# Patient Record
Sex: Female | Born: 1985 | Race: White | Hispanic: No | Marital: Single | Smoking: Current some day smoker
Health system: Southern US, Community
[De-identification: ages and names within clinical notes are randomized; demographics above are authoritative.]

## PROBLEM LIST (undated history)

## (undated) DIAGNOSIS — F410 Panic disorder [episodic paroxysmal anxiety] without agoraphobia: Secondary | ICD-10-CM

## (undated) DIAGNOSIS — F329 Major depressive disorder, single episode, unspecified: Secondary | ICD-10-CM

## (undated) DIAGNOSIS — E039 Hypothyroidism, unspecified: Secondary | ICD-10-CM

## (undated) DIAGNOSIS — G8929 Other chronic pain: Secondary | ICD-10-CM

## (undated) DIAGNOSIS — I10 Essential (primary) hypertension: Secondary | ICD-10-CM

## (undated) DIAGNOSIS — L732 Hidradenitis suppurativa: Secondary | ICD-10-CM

## (undated) DIAGNOSIS — M549 Dorsalgia, unspecified: Secondary | ICD-10-CM

## (undated) DIAGNOSIS — R000 Tachycardia, unspecified: Secondary | ICD-10-CM

## (undated) DIAGNOSIS — F32A Depression, unspecified: Secondary | ICD-10-CM

## (undated) DIAGNOSIS — E282 Polycystic ovarian syndrome: Secondary | ICD-10-CM

## (undated) HISTORY — PX: CHOLECYSTECTOMY: SHX55

## (undated) HISTORY — PX: DILATION AND CURETTAGE OF UTERUS: SHX78

## (undated) HISTORY — DX: Essential (primary) hypertension: I10

---
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
## 2008-09-28 ENCOUNTER — Emergency Department (HOSPITAL_COMMUNITY): Admission: EM | Admit: 2008-09-28 | Discharge: 2008-09-29 | Payer: Self-pay | Admitting: Emergency Medicine

## 2009-01-31 ENCOUNTER — Emergency Department (HOSPITAL_COMMUNITY): Admission: EM | Admit: 2009-01-31 | Discharge: 2009-02-01 | Payer: Self-pay | Admitting: Emergency Medicine

## 2010-02-05 ENCOUNTER — Ambulatory Visit: Payer: Self-pay | Admitting: Family Medicine

## 2010-02-13 ENCOUNTER — Emergency Department: Payer: Self-pay | Admitting: Emergency Medicine

## 2010-02-16 ENCOUNTER — Observation Stay: Payer: Self-pay | Admitting: Obstetrics and Gynecology

## 2010-02-19 LAB — PATHOLOGY REPORT

## 2010-05-19 ENCOUNTER — Emergency Department (HOSPITAL_COMMUNITY)
Admission: EM | Admit: 2010-05-19 | Discharge: 2010-05-19 | Disposition: A | Discharge status: Home / Self Care | Payer: Self-pay | Attending: Emergency Medicine | Admitting: Emergency Medicine

## 2010-05-19 DIAGNOSIS — N946 Dysmenorrhea, unspecified: Secondary | ICD-10-CM | POA: Insufficient documentation

## 2010-05-19 DIAGNOSIS — R109 Unspecified abdominal pain: Secondary | ICD-10-CM | POA: Insufficient documentation

## 2010-05-19 DIAGNOSIS — R110 Nausea: Secondary | ICD-10-CM | POA: Insufficient documentation

## 2010-05-19 LAB — URINALYSIS, ROUTINE W REFLEX MICROSCOPIC
Bilirubin Urine: NEGATIVE
Glucose, UA: NEGATIVE mg/dL
Leukocytes, UA: NEGATIVE
Nitrite: NEGATIVE
Specific Gravity, Urine: 1.013 (ref 1.005–1.030)
pH: 7.5 (ref 5.0–8.0)

## 2010-05-19 LAB — URINE MICROSCOPIC-ADD ON

## 2010-05-19 LAB — PREGNANCY, URINE: Preg Test, Ur: NEGATIVE

## 2010-06-13 ENCOUNTER — Emergency Department (HOSPITAL_COMMUNITY)
Admission: EM | Admit: 2010-06-13 | Discharge: 2010-06-14 | Disposition: A | Discharge status: Home / Self Care | Payer: Self-pay | Attending: Emergency Medicine | Admitting: Emergency Medicine

## 2010-06-13 DIAGNOSIS — R110 Nausea: Secondary | ICD-10-CM | POA: Insufficient documentation

## 2010-06-13 DIAGNOSIS — R51 Headache: Secondary | ICD-10-CM | POA: Insufficient documentation

## 2010-06-13 LAB — POCT PREGNANCY, URINE: Preg Test, Ur: NEGATIVE

## 2010-06-13 LAB — GLUCOSE, CAPILLARY: Glucose-Capillary: 127 mg/dL — ABNORMAL HIGH (ref 70–99)

## 2010-06-14 LAB — ETHANOL: Alcohol, Ethyl (B): <5 mg/dL (ref 0–10)

## 2010-06-14 LAB — BASIC METABOLIC PANEL
BUN: 5 mg/dL — ABNORMAL LOW (ref 6–23)
Calcium: 8.8 mg/dL (ref 8.4–10.5)
Creatinine, Ser: 0.67 mg/dL (ref 0.4–1.2)
GFR calc non Af Amer: >60 mL/min (ref >60)

## 2010-06-14 LAB — URINALYSIS, ROUTINE W REFLEX MICROSCOPIC
Nitrite: NEGATIVE
Protein, ur: NEGATIVE mg/dL
Urobilinogen, UA: 0.2 mg/dL (ref 0.0–1.0)

## 2010-06-14 LAB — CBC
MCV: 88.7 fL (ref 78.0–100.0)
Platelets: 205 10*3/uL (ref 150–400)
WBC: 8.3 10*3/uL (ref 4.0–10.5)

## 2010-06-14 LAB — DIFFERENTIAL
Eosinophils Absolute: 0 10*3/uL (ref 0.0–0.7)
Lymphs Abs: 2.1 10*3/uL (ref 0.7–4.0)
Neutrophils Relative %: 69 % (ref 43–77)

## 2010-06-14 LAB — RAPID URINE DRUG SCREEN, HOSP PERFORMED
Amphetamines: NOT DETECTED
Barbiturates: NOT DETECTED
Opiates: NOT DETECTED

## 2010-07-01 ENCOUNTER — Emergency Department (HOSPITAL_COMMUNITY)
Admission: EM | Admit: 2010-07-01 | Discharge: 2010-07-01 | Disposition: A | Discharge status: Home / Self Care | Payer: Self-pay | Attending: Emergency Medicine | Admitting: Emergency Medicine

## 2010-07-01 DIAGNOSIS — F988 Other specified behavioral and emotional disorders with onset usually occurring in childhood and adolescence: Secondary | ICD-10-CM | POA: Insufficient documentation

## 2010-07-01 DIAGNOSIS — K625 Hemorrhage of anus and rectum: Secondary | ICD-10-CM | POA: Insufficient documentation

## 2010-07-01 DIAGNOSIS — F319 Bipolar disorder, unspecified: Secondary | ICD-10-CM | POA: Insufficient documentation

## 2010-07-01 DIAGNOSIS — K6289 Other specified diseases of anus and rectum: Secondary | ICD-10-CM | POA: Insufficient documentation

## 2010-07-24 NOTE — Op Note (Signed)
   NAME:  Laurie Macias, CHOI                         ACCOUNT NO.:  [id number]   MEDICAL RECORD NO.:  [id number]                   PATIENT TYPE:  AMB   LOCATION:  DAY                                  FACILITY:  [facility]   PHYSICIAN:  Tilda Burrow, M.D.              DATE OF BIRTH:  [birth year]   DATE OF PROCEDURE:  DATE OF DISCHARGE:                                 OPERATIVE REPORT   PREOPERATIVE DIAGNOSIS:  Missed abortion, [redacted] weeks gestation.  Blood type O  positive.   POSTOPERATIVE DIAGNOSIS:  Missed abortion, [redacted] weeks gestation.  Blood type O  positive.   PROCEDURE:  Suction D&C.   SURGEON:  Tilda Burrow, M.D.   ASSISTANT:  None.   ANESTHESIA:  General with endotracheal intubation.   COMPLICATIONS:  None.   FINDINGS:  The uterus sounded to 12 cm pre-procedure.  Generous tissue  fragments and blood consistent with missed AB.   DESCRIPTION OF PROCEDURE:  The patient was taken to the operating room and  prepped and draped in the usual fashion for suction D&C with legs in yellow  fin supports and general anesthesia obtained with endotracheal intubation  successfully achieved.   A single-tooth tenaculum was used to grasp the cervix, with bivalve speculum  in place and the uterus dilated to 31 [place] in the anteflex position.  A 9-  mm curved suction curet was introduced easily.  Suction curettage under 45  mmHg of suction pressure performed removing blood and tissue fragments  consistent with missed AB.  Estimated blood loss 150 cc.  Sharp curettage at  the end of the procedure confirmed satisfactory uterine evacuation and a  smaller uterine cavity.  No suspicious of perforation or complication noted.  The patient went to the recovery room in good condition.   NOTE TO OFFICE:  The patient had several preoperative questions about  contraception in the future and has wishes to use oral contraceptives if  they will not cause weight gain but has been counseled over alternative  methods, including FemRing.                                               Tilda Burrow, M.D.    JVF/MEDQ  D:  05/31/2002  T:  06/01/2002  Job:  [id number]

## 2010-07-24 NOTE — H&P (Signed)
   NAME:  Laurie Macias, Laurie Macias NO.:  [id number]   MEDICAL RECORD NO.:  [id number]                  PATIENT TYPE:   LOCATION:                                       FACILITY:  [facility]   PHYSICIAN:  Tilda Burrow, M.D.              DATE OF BIRTH:   DATE OF ADMISSION:  05/30/2002  DATE OF DISCHARGE:                                HISTORY & PHYSICAL   ADMISSION DIAGNOSES:  1. Intrauterine pregnancy 9 weeks 6 days.  2. Missed abortion.   HISTORY OF PRESENT ILLNESS:  This 25 year old female, gravida 1, para 0, LMP  March 22, 2002, well-known patient with certain gestational age was seen  in our office on May 25, 2002, showing an __________ thickened endometrium  and an irregular sac contour which has collapsed.  She has not had any  bleeding.  Quantitative HCG was 30,040, suspicion of empty sac that is  collapsing was suspected.  She was seen back five days later and again, no  change in condition, and no sac development.  Missed AB is considered  certain and options reviewed with the patient including continued  observation or proceeding towards D&C.  The patient elects to proceed with  D&C for inevitable pregnancy loss.   PRENATAL LABORATORY DATA:  Blood type O positive, GBS negative.  Urine drug  screen, hemoglobin 12, hematocrit 37, white count 6900, hepatitis, HIV, GC,  Chlamydia, RPR and Pap smear all normal.  Rubella immunity present.   PAST MEDICAL HISTORY:  Positive for attention-deficit disorder and chronic  depression.  The patient used to be on medicines and stopped therapy at that  time.  Medical history also positive for vulvodynia.   PAST SURGICAL HISTORY:  Cholecystectomy in 2001.   ALLERGIES:  None.   SOCIAL HISTORY:  In [employer], tenth grader.  Lives with  grandmother.   FAMILY HISTORY:  Positive for hypertension, lung cancer and stroke.   PHYSICAL EXAMINATION:  VITAL SIGNS:  Height 5 feet 3 inches, weight 252,  blood  pressure 120/75.  GENERAL:  She is an obese, Caucasian female, alert and oriented x3.  Pupils  are equal, round, and reactive.  Pharynx easily visualized uvula.  NECK:  Supple.  Trachea midline.  CHEST:  Clear to auscultation.  ABDOMEN:  Nontender, obese.  PELVIC:  External genitalia nulliparous.  Cervix closed.  Uterus anterior on  ultrasound showing collapsing intrauterine tissues consistent with a missed  AB.   PLAN:  Suction D&C on May 31, 2002.                                               Tilda Burrow, M.D.    JVF/MEDQ  D:  05/30/2002  T:  05/30/2002  Job:  [id number]

## 2010-07-24 NOTE — H&P (Signed)
[facility]  Patient:    Laurie Macias, Laurie Macias Visit Number: [id number] MRN: [id number]          Service Type: OUT Location: RAD Attending Physician:  Milana Obey Dictated by:   Elpidio Anis, M.D. Admit Date:  04/19/2001 Discharge Date: 04/19/2001                           History and Physical  HISTORY OF PRESENT ILLNESS:  This is a 25 year old female with a history of documented gallbladder disease.  She has had postprandial nausea with bloating but no vomiting.  She has had diffuse upper abdominal pain.  She has not had diarrhea. She was evaluated by her primary care physician who is Dr. Sudie Bailey.  She had a hepatobiliary scan which showed an ejection fraction of 12%.  Ultrasound showed gallstones.  The patient is scheduled for cholecystectomy.  PAST MEDICAL HISTORY:  She has chronic depression with anxiety and documented mood swings.  MEDICATIONS: 1. Adderall 30 mg q.d. 2. Topamax 100 mg q.d. 3. Phenergan 25 mg q.4h. p.r.n.  FAMILY HISTORY:  Positive for diabetes mellitus, hypertension, atherosclerotic heart disease, lung cancer and a stroke.  REVIEW OF SYSTEMS:  Negative except for anxiety and depression.  PHYSICAL EXAMINATION:  VITAL SIGNS:  Blood pressure 112/72, pulse 60, respirations 18, weight 229 pounds.  HEENT:  Unremarkable except that she has a right upper eyelid piercing laterally.  There is no inflammation.  NECK:  Supple.  No adenopathy.  CHEST:  Clear to auscultation.  HEART:  Regular rhythm without murmur, rub or gallop  ABDOMEN:  Soft with mild epigastric and right upper quadrant tenderness.  No masses.  Normal bowel sounds.  EXTREMITIES:  No cyanosis, clubbing or edema.  NEUROLOGIC:  Nonfocal.  IMPRESSION: 1. Cholelithiasis and cholecystitis. 2. Depressive disorder.  PLAN:  Laparoscopic cholecystectomy. Dictated by:   Elpidio Anis, M.D. Attending Physician:  Milana Obey DD:  05/04/01 TD:   05/04/01 Job: [id number]

## 2010-07-24 NOTE — Op Note (Signed)
[facility]  Patient:    Laurie Macias, Laurie Macias Visit Number: [id number] MRN: [id number]          Service Type: SUR Location: 3A A316 01 Attending Physician:  Dessa Phi Dictated by:   Elpidio Anis, M.D. Admit Date:  05/05/2001                             Operative Report  PREOPERATIVE DIAGNOSIS:   Cholelithiasis, cholecystitis.  POSTOPERATIVE DIAGNOSIS:  Cholelithiasis, cholecystitis.  OPERATION/PROCEDURE:  Laparoscopic cholecystectomy.  SURGEON:  Elpidio Anis, M.D.  DESCRIPTION OF PROCEDURE:  Under general endotracheal anesthesia, the patients abdomen was prepped and draped in the sterile field.  A subumbilical incision was made.  Veress needle was inserted without difficulty.  Position was confirmed by the saline drop test.  The abdomen was insufflated with three liters of CO2.  Using a _____ guider, a 10 mm port was placed and laparoscope was placed.  Gallbladder was visualized.  Using videoscopic guidance, a 10 mm port and two 5 mm ports were placed in the right upper quadrant.  The patient was positioned.  The cystic duct was dissected, clipped with five clips and divided.  The cystic artery was dissected and clipped with three clips and divided.  The gallbladder was separated from the infrahepatic space using electrocautery.  Gallbladder was grasped and retrieved.  Irrigation was carried out.  There was no bleeding and no bile leak.  The patient tolerated the procedure well.  CO2 was allowed to escape from the abdomen and the ports were removed.  The incisions were closed using 0 Dexon on the fascia and staples on the skin.  Dressings were placed.  She was awakened from anesthesia and transferred to a bed and taken to the post anesthesia care unit. Dictated by:   Elpidio Anis, M.D. Attending Physician:  Dessa Phi DD:  05/05/01 TD:  05/05/01 Job: [id number]

## 2010-12-25 ENCOUNTER — Emergency Department: Payer: Self-pay | Admitting: Emergency Medicine

## 2011-01-09 ENCOUNTER — Emergency Department: Payer: Self-pay | Admitting: Unknown Physician Specialty

## 2011-01-30 ENCOUNTER — Emergency Department: Payer: Self-pay | Admitting: Emergency Medicine

## 2011-04-18 ENCOUNTER — Emergency Department: Payer: Self-pay | Admitting: Emergency Medicine

## 2011-06-20 ENCOUNTER — Emergency Department: Payer: Self-pay | Admitting: Emergency Medicine

## 2011-09-10 ENCOUNTER — Encounter (HOSPITAL_COMMUNITY): Payer: Self-pay | Admitting: *Deleted

## 2011-09-10 ENCOUNTER — Emergency Department (HOSPITAL_COMMUNITY)
Admission: EM | Admit: 2011-09-10 | Discharge: 2011-09-10 | Disposition: A | Discharge status: Home / Self Care | Payer: Self-pay | Attending: Emergency Medicine | Admitting: Emergency Medicine

## 2011-09-10 DIAGNOSIS — M5430 Sciatica, unspecified side: Secondary | ICD-10-CM | POA: Insufficient documentation

## 2011-09-10 DIAGNOSIS — Z87891 Personal history of nicotine dependence: Secondary | ICD-10-CM | POA: Insufficient documentation

## 2011-09-10 HISTORY — DX: Hidradenitis suppurativa: L73.2

## 2011-09-10 MED ORDER — CYCLOBENZAPRINE HCL 10 MG PO TABS: 10.0000 mg | ORAL_TABLET | Freq: Two times a day (BID) | ORAL | Status: AC | PRN

## 2011-09-10 MED ORDER — PREDNISONE 10 MG PO TABS: 40.0000 mg | ORAL_TABLET | Freq: Every day | ORAL | Status: DC

## 2011-09-10 NOTE — ED Provider Notes (Signed)
Medical screening examination/treatment/procedure(s) were performed by non-physician practitioner and as supervising physician I was immediately available for consultation/collaboration.   Lyanne Co, MD 09/10/11 [phone]

## 2011-09-10 NOTE — Progress Notes (Signed)
Pt listed as self pay with no insurance coverage Pt confirms she is self pay [employer] resident.  CM and [facility] community liaison spoke with her Pt offered [facility] services to assist with finding a [place] self pay provider- free clinic Pt states she visits [facility]

## 2011-09-10 NOTE — ED Notes (Signed)
Pt states "I've been having trouble with my back since Dec of 2011, here visiting relatives, never had my back x-rayed, it hurts in the middle of my back and sometimes the pain goes into my legs".

## 2011-09-10 NOTE — ED Provider Notes (Signed)
History     CSN: [id number]  Arrival date & time 09/10/11  1355   First MD Initiated Contact with Patient 09/10/11 1505      Chief Complaint  Patient presents with  . Back Pain    (Consider location/radiation/quality/duration/timing/severity/associated sxs/prior treatment) HPI Comments: Patient here with lower back pain - she states this has been going on for the past 2 years - that now it is getting worse - worse with movement - states now with radiation into bilateral buttocks - denies fever, chills, IV drug use, cancer, dysuria, abdominal or pelvic pain, numbness, tingling, loss of control of bowels of bladder or urinary retention.  Patient is a 26 y.o. female presenting with back pain. The history is provided by the patient. No language interpreter was used.  Back Pain  This is a chronic problem. The current episode started more than 1 week ago. The problem occurs constantly. The problem has not changed since onset.The pain is associated with no known injury. The pain is present in the lumbar spine. The quality of the pain is described as shooting. Radiates to: bilateral buttocks. The pain is at a severity of 6/10. The pain is moderate. The symptoms are aggravated by bending, twisting and certain positions. The pain is the same all the time. Stiffness is present all day. Pertinent negatives include no chest pain, no fever, no numbness, no weight loss, no headaches, no abdominal pain, no abdominal swelling, no bowel incontinence, no perianal numbness, no bladder incontinence, no dysuria, no pelvic pain, no leg pain, no paresthesias, no paresis, no tingling and no weakness. She has tried NSAIDs for the symptoms. The treatment provided no relief. Risk factors include obesity.    Past Medical History  Diagnosis Date  . Hydradenitis     History reviewed. No pertinent past surgical history.  No family history on file.  History  Substance Use Topics  . Smoking status: Former Games developer  .  Smokeless tobacco: Not on file  . Alcohol Use: No    OB History    Grav Para Term Preterm Abortions TAB SAB Ect Mult Living                  Review of Systems  Constitutional: Negative for fever, chills and weight loss.  HENT: Negative for neck pain.   Eyes: Negative for pain.  Respiratory: Negative for chest tightness and shortness of breath.   Cardiovascular: Negative for chest pain.  Gastrointestinal: Negative for abdominal pain and bowel incontinence.  Genitourinary: Negative for bladder incontinence, dysuria and pelvic pain.  Musculoskeletal: Positive for back pain.  Neurological: Negative for tingling, weakness, numbness, headaches and paresthesias.  All other systems reviewed and are negative.    Allergies  Review of patient's allergies indicates no known allergies.  Home Medications   Current Outpatient Rx  Name Route Sig Dispense Refill  . IBUPROFEN 800 MG PO TABS Oral Take 800 mg by mouth every 8 (eight) hours as needed. For pain    . PROMETHAZINE HCL 25 MG PO TABS Oral Take 25 mg by mouth every 6 (six) hours as needed. For nausea      BP 127/75  Pulse 102  Temp 98.6 F (37 C) (Oral)  Resp 18  Wt 330 lb (149.687 kg)  SpO2 100%  LMP 07/23/2011  Physical Exam  Nursing note and vitals reviewed. Constitutional: She is oriented to person, place, and time. She appears well-developed and well-nourished. No distress.       Morbidly obese  HENT:  Head: Normocephalic and atraumatic.  Right Ear: External ear normal.  Left Ear: External ear normal.  Nose: Nose normal.  Mouth/Throat: Oropharynx is clear and moist. No oropharyngeal exudate.  Eyes: Conjunctivae are normal. Pupils are equal, round, and reactive to light. No scleral icterus.  Neck: Normal range of motion. Neck supple.  Cardiovascular: Normal rate, regular rhythm and normal heart sounds.  Exam reveals no gallop and no friction rub.   No murmur heard. Pulmonary/Chest: Effort normal and breath sounds  normal. No respiratory distress. She has no wheezes. She has no rales. She exhibits no tenderness.  Abdominal: Soft. Bowel sounds are normal. She exhibits no distension. There is no tenderness.  Musculoskeletal:       Lumbar back: She exhibits tenderness, bony tenderness, pain and spasm. She exhibits normal range of motion.  Lymphadenopathy:    She has no cervical adenopathy.  Neurological: She is alert and oriented to person, place, and time. She has normal reflexes. No cranial nerve deficit. She exhibits normal muscle tone. Coordination normal.  Skin: Skin is warm and dry. No rash noted. No erythema. No pallor.  Psychiatric: She has a normal mood and affect. Her behavior is normal. Judgment and thought content normal.    ED Course  Procedures (including critical care time)  Labs Reviewed - No data to display No results found.   Sciatica   MDM  Patient here with lower back pain with symptoms consistent with sciatica - counseled patient on proper lifting techinques, and weight loss for assistance - will place on steroids and muscle relaxers.        Izola Price Weir, [place] 09/10/11 1529

## 2011-10-08 ENCOUNTER — Encounter (HOSPITAL_COMMUNITY): Payer: Self-pay | Admitting: Emergency Medicine

## 2011-10-08 ENCOUNTER — Emergency Department (HOSPITAL_COMMUNITY)
Admission: EM | Admit: 2011-10-08 | Discharge: 2011-10-08 | Disposition: A | Discharge status: Home / Self Care | Payer: Self-pay | Attending: Emergency Medicine | Admitting: Emergency Medicine

## 2011-10-08 DIAGNOSIS — K089 Disorder of teeth and supporting structures, unspecified: Secondary | ICD-10-CM | POA: Insufficient documentation

## 2011-10-08 DIAGNOSIS — K0889 Other specified disorders of teeth and supporting structures: Secondary | ICD-10-CM

## 2011-10-08 DIAGNOSIS — Z87891 Personal history of nicotine dependence: Secondary | ICD-10-CM | POA: Insufficient documentation

## 2011-10-08 HISTORY — DX: Major depressive disorder, single episode, unspecified: F32.9

## 2011-10-08 HISTORY — DX: Polycystic ovarian syndrome: E28.2

## 2011-10-08 HISTORY — DX: Depression, unspecified: F32.A

## 2011-10-08 MED ORDER — OXYCODONE-ACETAMINOPHEN 5-325 MG PO TABS: 1.0000 | ORAL_TABLET | Freq: Four times a day (QID) | ORAL | Status: AC | PRN

## 2011-10-08 MED ORDER — ONDANSETRON 8 MG PO TBDP: 8.0000 mg | ORAL_TABLET | Freq: Three times a day (TID) | ORAL | Status: AC | PRN

## 2011-10-08 MED ORDER — PENICILLIN V POTASSIUM 500 MG PO TABS: 500.0000 mg | ORAL_TABLET | Freq: Three times a day (TID) | ORAL | Status: AC

## 2011-10-08 NOTE — ED Notes (Signed)
C/o intermittent toothache over the past couple of months.  States pain is worse today with nausea.

## 2011-10-08 NOTE — ED Provider Notes (Signed)
History     CSN: [id number]  Arrival date & time 10/08/11  1904   First MD Initiated Contact with Patient 10/08/11 1934      Chief Complaint  Patient presents with  . Dental Pain    (Consider location/radiation/quality/duration/timing/severity/associated sxs/prior treatment) HPI Comments: Patient reports that she has had intermittent pain of her left lower molar tooth for the past 6 months.  Pain worse over the past couple of days.  No acute dental injury or trauma.  She has tried taking Tylenol and Ibuprofen for the pain, but does not feel that it helps.  She does not have a dentist.  Patient is a 26 y.o. female presenting with tooth pain. The history is provided by the patient.  Dental PainThe primary symptoms include mouth pain. Primary symptoms do not include dental injury, oral bleeding, headaches or fever. The symptoms are worsening. The symptoms occur constantly.  Additional symptoms include: dental sensitivity to temperature, gum swelling and gum tenderness. Additional symptoms do not include: purulent gums, trismus, facial swelling, trouble swallowing, pain with swallowing, drooling and ear pain.    Past Medical History  Diagnosis Date  . Hydradenitis   . Polycystic ovarian disease   . Depression     History reviewed. No pertinent past surgical history.  No family history on file.  History  Substance Use Topics  . Smoking status: Former Games developer  . Smokeless tobacco: Not on file  . Alcohol Use: No    OB History    Grav Para Term Preterm Abortions TAB SAB Ect Mult Living                  Review of Systems  Constitutional: Negative for fever and chills.  HENT: Negative for ear pain, facial swelling, drooling, trouble swallowing, neck pain, neck stiffness and voice change.   Gastrointestinal: Negative for nausea and vomiting.  Neurological: Negative for headaches.    Allergies  Review of patient's allergies indicates no known allergies.  Home Medications    Current Outpatient Rx  Name Route Sig Dispense Refill  . CITALOPRAM HYDROBROMIDE 20 MG PO TABS Oral Take 20 mg by mouth daily.    . CYCLOBENZAPRINE HCL 5 MG PO TABS Oral Take 5 mg by mouth 3 (three) times daily as needed. For back pain    . IBUPROFEN 200 MG PO TABS Oral Take 800 mg by mouth every 6 (six) hours as needed. For pain      BP 140/90  Temp 98.2 F (36.8 C) (Oral)  Resp 18  SpO2 100%  LMP 07/23/2011  Physical Exam  Nursing note and vitals reviewed. Constitutional: She is oriented to person, place, and time. She appears well-developed and well-nourished. No distress.  HENT:  Head: Normocephalic and atraumatic. No trismus in the jaw.  Mouth/Throat: Uvula is midline, oropharynx is clear and moist and mucous membranes are normal. Abnormal dentition. No dental abscesses or uvula swelling. No oropharyngeal exudate, posterior oropharyngeal edema, posterior oropharyngeal erythema or tonsillar abscesses.       Poor dental hygiene. Pt able to open and close mouth with out difficulty. Airway intact. Uvula midline. Mild gingival swelling with tenderness over affected area, but no fluctuance. No swelling or tenderness of submental and submandibular regions.  Neck: Normal range of motion and full passive range of motion without pain. Neck supple.  Cardiovascular: Normal rate and regular rhythm.   Pulmonary/Chest: Effort normal and breath sounds normal. No respiratory distress. She has no wheezes.  Musculoskeletal: Normal range of motion.  Lymphadenopathy:       Head (right side): No submental, no submandibular, no tonsillar, no preauricular and no posterior auricular adenopathy present.       Head (left side): No submental, no submandibular, no tonsillar, no preauricular and no posterior auricular adenopathy present.    She has no cervical adenopathy.  Neurological: She is alert and oriented to person, place, and time.  Skin: Skin is warm and dry. No rash noted. She is not diaphoretic.     ED Course  Procedures (including critical care time)  Labs Reviewed - No data to display No results found.   No diagnosis found.    MDM  Patient with toothache.  No gross abscess.  Exam unconcerning for Ludwig's angina or spread of infection.  Will treat with penicillin and pain medicine.  Urged patient to follow-up with dentist.          Pascal Lux [place], PA-C 10/09/11 [phone]

## 2011-10-09 NOTE — ED Provider Notes (Signed)
Medical screening examination/treatment/procedure(s) were performed by non-physician practitioner and as supervising physician I was immediately available for consultation/collaboration.  Ethelda Chick, MD 10/09/11 [phone]

## 2012-01-08 ENCOUNTER — Encounter (HOSPITAL_COMMUNITY): Payer: Self-pay | Admitting: *Deleted

## 2012-01-08 ENCOUNTER — Emergency Department (HOSPITAL_COMMUNITY): Payer: Self-pay

## 2012-01-08 ENCOUNTER — Emergency Department (HOSPITAL_COMMUNITY)
Admission: EM | Admit: 2012-01-08 | Discharge: 2012-01-08 | Disposition: A | Discharge status: Home / Self Care | Payer: Self-pay | Attending: Emergency Medicine | Admitting: Emergency Medicine

## 2012-01-08 DIAGNOSIS — M545 Low back pain, unspecified: Secondary | ICD-10-CM | POA: Insufficient documentation

## 2012-01-08 DIAGNOSIS — Z8742 Personal history of other diseases of the female genital tract: Secondary | ICD-10-CM | POA: Insufficient documentation

## 2012-01-08 DIAGNOSIS — Z8639 Personal history of other endocrine, nutritional and metabolic disease: Secondary | ICD-10-CM | POA: Insufficient documentation

## 2012-01-08 DIAGNOSIS — F3289 Other specified depressive episodes: Secondary | ICD-10-CM | POA: Insufficient documentation

## 2012-01-08 DIAGNOSIS — Z87891 Personal history of nicotine dependence: Secondary | ICD-10-CM | POA: Insufficient documentation

## 2012-01-08 DIAGNOSIS — G8929 Other chronic pain: Secondary | ICD-10-CM

## 2012-01-08 DIAGNOSIS — Z79899 Other long term (current) drug therapy: Secondary | ICD-10-CM | POA: Insufficient documentation

## 2012-01-08 DIAGNOSIS — F329 Major depressive disorder, single episode, unspecified: Secondary | ICD-10-CM | POA: Insufficient documentation

## 2012-01-08 DIAGNOSIS — Z862 Personal history of diseases of the blood and blood-forming organs and certain disorders involving the immune mechanism: Secondary | ICD-10-CM | POA: Insufficient documentation

## 2012-01-08 HISTORY — DX: Other chronic pain: G89.29

## 2012-01-08 HISTORY — DX: Dorsalgia, unspecified: M54.9

## 2012-01-08 MED ORDER — OXYCODONE-ACETAMINOPHEN 5-325 MG PO TABS: 1.0000 | ORAL_TABLET | ORAL | Status: DC | PRN

## 2012-01-08 MED ORDER — DIAZEPAM 5 MG PO TABS
5.0000 mg | ORAL_TABLET | Freq: Once | ORAL | Status: AC
  Administered 2012-01-08: 5 mg via ORAL
  Filled 2012-01-08: qty 1

## 2012-01-08 MED ORDER — PROMETHAZINE HCL 25 MG PO TABS: 25.0000 mg | ORAL_TABLET | Freq: Four times a day (QID) | ORAL | Status: DC | PRN

## 2012-01-08 MED ORDER — OXYCODONE-ACETAMINOPHEN 5-325 MG PO TABS
1.0000 | ORAL_TABLET | Freq: Once | ORAL | Status: AC
  Administered 2012-01-08: 1 via ORAL
  Filled 2012-01-08: qty 1

## 2012-01-08 MED ORDER — DIAZEPAM 5 MG PO TABS: 5.0000 mg | ORAL_TABLET | Freq: Two times a day (BID) | ORAL | Status: DC

## 2012-01-08 MED ORDER — KETOROLAC TROMETHAMINE 60 MG/2ML IM SOLN
60.0000 mg | Freq: Once | INTRAMUSCULAR | Status: AC
  Administered 2012-01-08: 60 mg via INTRAMUSCULAR
  Filled 2012-01-08: qty 2

## 2012-01-08 NOTE — ED Provider Notes (Signed)
History     CSN: [id number]  Arrival date & time 01/08/12  0225   First MD Initiated Contact with Patient 01/08/12 [phone]      Chief Complaint  Patient presents with  . Back Pain    (Consider location/radiation/quality/duration/timing/severity/associated sxs/prior treatment) HPI History provided by pt.   Pt has had intermittent low back pain x 1.5 years.  It has gradually been increasing in intensity and became severe acutely last night.  Occasionally wakes her from sleep.  Radiates down right leg to level of knee and associated w/ occasional RLE tingling and weakness.  Aggravated by movement and improves w/ laying flat. Denies recent trauma.  Denies fever, abd pain, bladder/bowel dysfunction.  No h/o cancer.  Past Medical History  Diagnosis Date  . Hydradenitis   . Polycystic ovarian disease   . Depression   . Chronic back pain     Past Surgical History  Procedure Date  . Cholecystectomy   . Dilation and curettage of uterus     No family history on file.  History  Substance Use Topics  . Smoking status: Former Games developer  . Smokeless tobacco: Not on file  . Alcohol Use: No    OB History    Grav Para Term Preterm Abortions TAB SAB Ect Mult Living                  Review of Systems  All other systems reviewed and are negative.    Allergies  Review of patient's allergies indicates no known allergies.  Home Medications   Current Outpatient Rx  Name Route Sig Dispense Refill  . ACETAMINOPHEN 500 MG PO TABS Oral Take 1,000 mg by mouth every 4 (four) hours as needed. Back pain    . IBUPROFEN 200 MG PO TABS Oral Take 800 mg by mouth every 6 (six) hours as needed. For pain    . NAPROXEN SODIUM 220 MG PO TABS Oral Take 220 mg by mouth 2 (two) times daily as needed. Back pain    . DIAZEPAM 5 MG PO TABS Oral Take 1 tablet (5 mg total) by mouth 2 (two) times daily. 10 tablet 0  . OXYCODONE-ACETAMINOPHEN 5-325 MG PO TABS Oral Take 1 tablet by mouth every 4 (four) hours as  needed for pain. 20 tablet 0  . PROMETHAZINE HCL 25 MG PO TABS Oral Take 1 tablet (25 mg total) by mouth every 6 (six) hours as needed for nausea. 20 tablet 0    BP 143/103  Pulse 97  Temp 98 F (36.7 C) (Oral)  Resp 16  SpO2 100%  LMP 12/08/2011  Physical Exam  Nursing note and vitals reviewed. Constitutional: She is oriented to person, place, and time. She appears well-developed and well-nourished.       Morbidly obese  HENT:  Head: Normocephalic and atraumatic.  Eyes:       Normal appearance  Neck: Normal range of motion.  Cardiovascular: Normal rate and regular rhythm.   Pulmonary/Chest: Effort normal and breath sounds normal.  Abdominal: Soft. Bowel sounds are normal. She exhibits no distension. There is no tenderness.  Genitourinary:       No CVA ttp  Musculoskeletal:       Thoracic and lumbar spinal and right lumbar paraspinal ttp. Full active ROM of LE but right straight leg raise as well as turning over in bed reproduces the pain.  Nml patellar reflexes.  No saddle anesthesia. Distal sensation intact.  2+ DP pulses.  Ambulates w/out diffulty.  Neurological: She is alert and oriented to person, place, and time.  Skin: Skin is warm and dry. No rash noted.  Psychiatric: She has a normal mood and affect. Her behavior is normal.    ED Course  Procedures (including critical care time)   Labs Reviewed  POCT PREGNANCY, URINE   Dg Thoracic Spine 2 View  01/08/2012  *RADIOLOGY REPORT*  Clinical Data: Lower to mid back pain for 2 years.  THORACIC SPINE - 2 VIEW  Comparison: Chest 08/02/2005.  Findings: Normal alignment of the thoracic vertebra.  No vertebral compression deformities.  Intervertebral disc space heights are preserved.  Mild endplate hypertrophic changes.  No focal bone lesion or bone destruction.  Bone cortex and trabecular architecture appear intact.  No paraspinal soft tissue swelling.  IMPRESSION: No displaced fractures identified.  Normal alignment.    Original Report Authenticated By: Burman Nieves, M.D.    Dg Lumbar Spine Complete  01/08/2012  *RADIOLOGY REPORT*  Clinical Data: Low back pain for 2 years.  LUMBAR SPINE - COMPLETE 4+ VIEW  Comparison: 08/02/2005  Findings: Five lumbar type vertebra.  Normal alignment of the lumbar vertebrae and facet joints.  No vertebral compression deformities.  Intervertebral disc space heights are preserved.  No focal bone lesion or bone destruction.  Bone cortex and trabecular architecture appear intact.  No significant change since previous study.  IMPRESSION: No displaced fractures identified.   Original Report Authenticated By: Burman Nieves, M.D.      1. Chronic low back pain       MDM  26yo morbidly obese F presents w/ acute on chronic low back pain.  Xrays ordered d/t duration of sx and mid-line tenderness and are neg.  No indication for emergent MRI this morning though she will likely need one in the near future.  Suspect that she has sciatica.  Pain improved in ED w/ IM toradol, valium and vicodin.  She is ambulatory.  Discharged home w/ referral to [facility] and [facility].   Return precautions discussed.        Otilio Miu, PA 01/08/12 1749  Otilio Miu, PA 01/08/12 1749

## 2012-01-08 NOTE — ED Notes (Signed)
TRANSPORTED TO X-RAY. 

## 2012-01-08 NOTE — ED Notes (Signed)
Pt to ED c/o bil lower back pain x 1 year.  Tonight the pain began radiating down R leg.  Pt also c/o sob and states she has had a productive cough x 1 week.

## 2012-01-09 NOTE — ED Provider Notes (Signed)
Medical screening examination/treatment/procedure(s) were performed by non-physician practitioner and as supervising physician I was immediately available for consultation/collaboration.   Hanley Seamen, MD 01/09/12 [phone]

## 2012-03-16 ENCOUNTER — Emergency Department: Payer: Self-pay | Admitting: Emergency Medicine

## 2012-03-28 ENCOUNTER — Encounter (HOSPITAL_COMMUNITY): Payer: Self-pay | Admitting: Adult Health

## 2012-03-28 ENCOUNTER — Emergency Department (HOSPITAL_COMMUNITY)
Admission: EM | Admit: 2012-03-28 | Discharge: 2012-03-29 | Disposition: A | Discharge status: Home / Self Care | Payer: Self-pay | Attending: Emergency Medicine | Admitting: Emergency Medicine

## 2012-03-28 DIAGNOSIS — Z8659 Personal history of other mental and behavioral disorders: Secondary | ICD-10-CM | POA: Insufficient documentation

## 2012-03-28 DIAGNOSIS — Z87891 Personal history of nicotine dependence: Secondary | ICD-10-CM | POA: Insufficient documentation

## 2012-03-28 DIAGNOSIS — R110 Nausea: Secondary | ICD-10-CM | POA: Insufficient documentation

## 2012-03-28 DIAGNOSIS — G8929 Other chronic pain: Secondary | ICD-10-CM | POA: Insufficient documentation

## 2012-03-28 DIAGNOSIS — Z8742 Personal history of other diseases of the female genital tract: Secondary | ICD-10-CM | POA: Insufficient documentation

## 2012-03-28 DIAGNOSIS — R109 Unspecified abdominal pain: Secondary | ICD-10-CM | POA: Insufficient documentation

## 2012-03-28 DIAGNOSIS — M549 Dorsalgia, unspecified: Secondary | ICD-10-CM | POA: Insufficient documentation

## 2012-03-28 DIAGNOSIS — Z79899 Other long term (current) drug therapy: Secondary | ICD-10-CM | POA: Insufficient documentation

## 2012-03-28 DIAGNOSIS — B349 Viral infection, unspecified: Secondary | ICD-10-CM

## 2012-03-28 DIAGNOSIS — B9789 Other viral agents as the cause of diseases classified elsewhere: Secondary | ICD-10-CM | POA: Insufficient documentation

## 2012-03-28 DIAGNOSIS — Z872 Personal history of diseases of the skin and subcutaneous tissue: Secondary | ICD-10-CM | POA: Insufficient documentation

## 2012-03-28 MED ORDER — ONDANSETRON 4 MG PO TBDP: 4.0000 mg | ORAL_TABLET | Freq: Three times a day (TID) | ORAL | Status: DC | PRN

## 2012-03-28 MED ORDER — KETOROLAC TROMETHAMINE 60 MG/2ML IM SOLN
60.0000 mg | Freq: Once | INTRAMUSCULAR | Status: AC
  Administered 2012-03-28: 60 mg via INTRAMUSCULAR
  Filled 2012-03-28: qty 2

## 2012-03-28 NOTE — ED Notes (Signed)
Pt. States around 1400 today started feeling ill. Reports aches all over body and nausea. Denies V/D, cough.

## 2012-03-28 NOTE — ED Provider Notes (Signed)
History   This chart was scribed for non-physician practitioner working with Raeford Razor, MD by Smitty Pluck. This patient was seen in room TR05C/TR05C and the patient's care was started at 11:22 PM.   CSN: [id number]  Arrival date & time 03/28/12  2233      Chief Complaint  Patient presents with  . Generalized Body Aches     The history is provided by the patient. No language interpreter was used.   Laurie Macias is a 27 y.o. female who presents to the Emergency Department complaining of constant, moderate general body aches onset today at 7 PM. Pt reports that she has nausea and mild abdominal pain. She reports that she has taken OTC cold medication without relief. She denies sick contact. Pt denies fever, chills, vomiting, diarrhea, weakness, cough, SOB and any other pain.  Pt does not have PCP.  Past Medical History  Diagnosis Date  . Hydradenitis   . Polycystic ovarian disease   . Depression   . Chronic back pain     Past Surgical History  Procedure Date  . Cholecystectomy   . Dilation and curettage of uterus     History reviewed. No pertinent family history.  History  Substance Use Topics  . Smoking status: Former Games developer  . Smokeless tobacco: Not on file  . Alcohol Use: No    OB History    Grav Para Term Preterm Abortions TAB SAB Ect Mult Living                  Review of Systems  Constitutional: Negative for fever and chills.  Respiratory: Negative for cough and shortness of breath.   Gastrointestinal: Positive for nausea and abdominal pain. Negative for vomiting and diarrhea.  Neurological: Negative for weakness.  All other systems reviewed and are negative.    Allergies  Review of patient's allergies indicates no known allergies.  Home Medications   Current Outpatient Rx  Name  Route  Sig  Dispense  Refill  . IBUPROFEN 200 MG PO TABS   Oral   Take 800 mg by mouth every 6 (six) hours as needed. For pain         . MELOXICAM 15 MG PO  TABS   Oral   Take 15 mg by mouth daily.         Juanita Laster FLU/COLD/COUGH PO   Oral   Take 10 mLs by mouth every 8 (eight) hours as needed. For cough/congestion         . TRAMADOL HCL 50 MG PO TABS   Oral   Take 50 mg by mouth every 8 (eight) hours as needed. For pain           BP 161/91  Pulse 88  Temp 98.5 F (36.9 C)  Resp 16  SpO2 100%  Physical Exam  Nursing note and vitals reviewed. Constitutional: She is oriented to person, place, and time. She appears well-developed and well-nourished. No distress.  HENT:  Head: Normocephalic and atraumatic.  Right Ear: External ear normal.  Left Ear: External ear normal.  Mouth/Throat: Oropharynx is clear and moist.  Eyes: EOM are normal.  Neck: Normal range of motion. Neck supple. No tracheal deviation present.  Cardiovascular: Normal rate, regular rhythm and normal heart sounds.   Pulmonary/Chest: Effort normal and breath sounds normal. No respiratory distress. She has no wheezes.  Abdominal: Soft. Bowel sounds are normal. She exhibits no distension. There is no tenderness. There is no rebound and no guarding.  Musculoskeletal: Normal range of motion.  Lymphadenopathy:    She has no cervical adenopathy.  Neurological: She is alert and oriented to person, place, and time.  Skin: Skin is warm and dry.  Psychiatric: She has a normal mood and affect. Her behavior is normal.    ED Course  Procedures (including critical care time) DIAGNOSTIC STUDIES: Oxygen Saturation is 100% on room air, normal by my interpretation.    COORDINATION OF CARE: 11:25 PM Discussed ED treatment with pt and pt agrees.     Labs Reviewed - No data to display No results found.   No diagnosis found.  Viral illness.   MDM     I personally performed the services described in this documentation, which was scribed in my presence. The recorded information has been reviewed and is accurate.       Jimmye Norman, NP 03/28/12 [phone]

## 2012-03-28 NOTE — ED Notes (Signed)
Presents with body aches that began at 7 pm this evening. She states she has not been feeling good earlier today and took a nap, when she woke up she felt an all over generalized achyness to her body and nausea.

## 2012-03-31 NOTE — ED Provider Notes (Signed)
Medical screening examination/treatment/procedure(s) were performed by non-physician practitioner and as supervising physician I was immediately available for consultation/collaboration.  Raeford Razor, MD 03/31/12 [phone]

## 2012-06-07 ENCOUNTER — Encounter (HOSPITAL_COMMUNITY): Payer: Self-pay | Admitting: Emergency Medicine

## 2012-06-07 ENCOUNTER — Emergency Department (HOSPITAL_COMMUNITY)
Admission: EM | Admit: 2012-06-07 | Discharge: 2012-06-07 | Disposition: A | Discharge status: Home / Self Care | Payer: Self-pay | Attending: Emergency Medicine | Admitting: Emergency Medicine

## 2012-06-07 DIAGNOSIS — K0889 Other specified disorders of teeth and supporting structures: Secondary | ICD-10-CM

## 2012-06-07 DIAGNOSIS — Z862 Personal history of diseases of the blood and blood-forming organs and certain disorders involving the immune mechanism: Secondary | ICD-10-CM | POA: Insufficient documentation

## 2012-06-07 DIAGNOSIS — K089 Disorder of teeth and supporting structures, unspecified: Secondary | ICD-10-CM | POA: Insufficient documentation

## 2012-06-07 DIAGNOSIS — Z79899 Other long term (current) drug therapy: Secondary | ICD-10-CM | POA: Insufficient documentation

## 2012-06-07 DIAGNOSIS — Z8659 Personal history of other mental and behavioral disorders: Secondary | ICD-10-CM | POA: Insufficient documentation

## 2012-06-07 DIAGNOSIS — Z8739 Personal history of other diseases of the musculoskeletal system and connective tissue: Secondary | ICD-10-CM | POA: Insufficient documentation

## 2012-06-07 DIAGNOSIS — Z872 Personal history of diseases of the skin and subcutaneous tissue: Secondary | ICD-10-CM | POA: Insufficient documentation

## 2012-06-07 DIAGNOSIS — Z8639 Personal history of other endocrine, nutritional and metabolic disease: Secondary | ICD-10-CM | POA: Insufficient documentation

## 2012-06-07 DIAGNOSIS — Z87891 Personal history of nicotine dependence: Secondary | ICD-10-CM | POA: Insufficient documentation

## 2012-06-07 DIAGNOSIS — K029 Dental caries, unspecified: Secondary | ICD-10-CM | POA: Insufficient documentation

## 2012-06-07 HISTORY — DX: Hypothyroidism, unspecified: E03.9

## 2012-06-07 MED ORDER — HYDROCODONE-ACETAMINOPHEN 5-325 MG PO TABS: ORAL_TABLET | ORAL | Status: DC

## 2012-06-07 MED ORDER — AMOXICILLIN 500 MG PO CAPS: 500.0000 mg | ORAL_CAPSULE | Freq: Three times a day (TID) | ORAL | Status: DC

## 2012-06-07 NOTE — ED Provider Notes (Signed)
History     CSN: [id number]  Arrival date & time 06/07/12  1811   None     Chief Complaint  Patient presents with  . Dental Pain    (Consider location/radiation/quality/duration/timing/severity/associated sxs/prior treatment) Patient is a 27 y.o. female presenting with tooth pain. The history is provided by the patient.  Dental PainThe primary symptoms include mouth pain. Primary symptoms do not include dental injury, oral bleeding, oral lesions, headaches, fever, shortness of breath, sore throat, angioedema or cough. The symptoms are worsening. The symptoms are new. The symptoms occur constantly.  Affected locations include: gum(s).  Additional symptoms include: dental sensitivity to temperature and gum tenderness. Additional symptoms do not include: gum swelling, purulent gums, trismus, jaw pain, facial swelling, trouble swallowing, pain with swallowing, ear pain and swollen glands. Medical issues include: periodontal disease. Medical issues do not include: smoking.    Past Medical History  Diagnosis Date  . Hydradenitis   . Polycystic ovarian disease   . Depression   . Chronic back pain     Past Surgical History  Procedure Laterality Date  . Cholecystectomy    . Dilation and curettage of uterus      No family history on file.  History  Substance Use Topics  . Smoking status: Former Games developer  . Smokeless tobacco: Not on file  . Alcohol Use: No    OB History   Grav Para Term Preterm Abortions TAB SAB Ect Mult Living                  Review of Systems  Constitutional: Negative for fever and appetite change.  HENT: Positive for dental problem. Negative for ear pain, congestion, sore throat, facial swelling, trouble swallowing, neck pain and neck stiffness.   Eyes: Negative for pain and visual disturbance.  Respiratory: Negative for cough and shortness of breath.   Neurological: Negative for dizziness, facial asymmetry and headaches.  Hematological: Negative for  adenopathy.  All other systems reviewed and are negative.    Allergies  Review of patient's allergies indicates no known allergies.  Home Medications   Current Outpatient Rx  Name  Route  Sig  Dispense  Refill  . ibuprofen (ADVIL,MOTRIN) 200 MG tablet   Oral   Take 800 mg by mouth every 6 (six) hours as needed. For pain         . meloxicam (MOBIC) 15 MG tablet   Oral   Take 15 mg by mouth daily.         . ondansetron (ZOFRAN ODT) 4 MG disintegrating tablet   Oral   Take 1 tablet (4 mg total) by mouth every 8 (eight) hours as needed for nausea.   20 tablet   0   . Pseudoeph-CPM-DM-APAP (THERAFLU FLU/COLD/COUGH PO)   Oral   Take 10 mLs by mouth every 8 (eight) hours as needed. For cough/congestion         . traMADol (ULTRAM) 50 MG tablet   Oral   Take 50 mg by mouth every 8 (eight) hours as needed. For pain           There were no vitals taken for this visit.  Physical Exam  Nursing note and vitals reviewed. Constitutional: She is oriented to person, place, and time. She appears well-developed and well-nourished. No distress.  HENT:  Head: Normocephalic and atraumatic. No trismus in the jaw.  Right Ear: Tympanic membrane and ear canal normal.  Left Ear: Tympanic membrane and ear canal normal.  Mouth/Throat: Uvula  is midline, oropharynx is clear and moist and mucous membranes are normal. Dental caries present. No dental abscesses or edematous.    Dental decay w/o erythema, edema or fluctuance of the surrounding gums.  Airway patent.  No trismus  Neck: Normal range of motion. Neck supple.  Cardiovascular: Normal rate, regular rhythm and normal heart sounds.   No murmur heard. Pulmonary/Chest: Effort normal and breath sounds normal.  Musculoskeletal: Normal range of motion.  Lymphadenopathy:    She has no cervical adenopathy.  Neurological: She is alert and oriented to person, place, and time. She exhibits normal muscle tone. Coordination normal.  Skin:  Skin is warm and dry.    ED Course  Procedures (including critical care time)  Labs Reviewed - No data to display No results found.      MDM   Vitals , nursing notes were reviewed and considered.     Patient was reviewed on the [facility] narcotics database. No recent prescriptions have been filled.  i will prescribe amoxil, norco #15 for pain and give pt dental referral info.  The patient appears reasonably screened and/or stabilized for discharge and I doubt any other medical condition or other [facility] requiring further screening, evaluation, or treatment in the ED at this time prior to discharge.   Jaivyn Gulla L. Trisha Mangle, PA-C 06/07/12 1837

## 2012-06-07 NOTE — ED Provider Notes (Signed)
Medical screening examination/treatment/procedure(s) were performed by non-physician practitioner and as supervising physician I was immediately available for consultation/collaboration.   Naseem Adler, MD 06/07/12 2015 

## 2012-06-07 NOTE — ED Notes (Addendum)
Patient c/o left lower dental pain. Per patient pain "for years" but worse today. Patient reports calling multiple dentists but not being able to find any dentist to take her. Patient reports using orajel and tramadol today with no relief.

## 2012-07-06 ENCOUNTER — Emergency Department (HOSPITAL_COMMUNITY)
Admission: EM | Admit: 2012-07-06 | Discharge: 2012-07-06 | Disposition: A | Discharge status: Home / Self Care | Payer: Self-pay | Attending: Emergency Medicine | Admitting: Emergency Medicine

## 2012-07-06 ENCOUNTER — Encounter (HOSPITAL_COMMUNITY): Payer: Self-pay

## 2012-07-06 DIAGNOSIS — E039 Hypothyroidism, unspecified: Secondary | ICD-10-CM | POA: Insufficient documentation

## 2012-07-06 DIAGNOSIS — M549 Dorsalgia, unspecified: Secondary | ICD-10-CM | POA: Insufficient documentation

## 2012-07-06 DIAGNOSIS — Z8742 Personal history of other diseases of the female genital tract: Secondary | ICD-10-CM | POA: Insufficient documentation

## 2012-07-06 DIAGNOSIS — Z87891 Personal history of nicotine dependence: Secondary | ICD-10-CM | POA: Insufficient documentation

## 2012-07-06 DIAGNOSIS — R61 Generalized hyperhidrosis: Secondary | ICD-10-CM | POA: Insufficient documentation

## 2012-07-06 DIAGNOSIS — Z79899 Other long term (current) drug therapy: Secondary | ICD-10-CM | POA: Insufficient documentation

## 2012-07-06 DIAGNOSIS — Z8659 Personal history of other mental and behavioral disorders: Secondary | ICD-10-CM | POA: Insufficient documentation

## 2012-07-06 DIAGNOSIS — R110 Nausea: Secondary | ICD-10-CM | POA: Insufficient documentation

## 2012-07-06 DIAGNOSIS — Z872 Personal history of diseases of the skin and subcutaneous tissue: Secondary | ICD-10-CM | POA: Insufficient documentation

## 2012-07-06 DIAGNOSIS — R42 Dizziness and giddiness: Secondary | ICD-10-CM | POA: Insufficient documentation

## 2012-07-06 DIAGNOSIS — R259 Unspecified abnormal involuntary movements: Secondary | ICD-10-CM | POA: Insufficient documentation

## 2012-07-06 DIAGNOSIS — F419 Anxiety disorder, unspecified: Secondary | ICD-10-CM

## 2012-07-06 DIAGNOSIS — F411 Generalized anxiety disorder: Secondary | ICD-10-CM | POA: Insufficient documentation

## 2012-07-06 DIAGNOSIS — G8929 Other chronic pain: Secondary | ICD-10-CM | POA: Insufficient documentation

## 2012-07-06 MED ORDER — ALPRAZOLAM 0.5 MG PO TABS
0.5000 mg | ORAL_TABLET | Freq: Once | ORAL | Status: AC
  Administered 2012-07-06: 0.5 mg via ORAL
  Filled 2012-07-06: qty 1

## 2012-07-06 MED ORDER — ONDANSETRON 8 MG PO TBDP
8.0000 mg | ORAL_TABLET | Freq: Once | ORAL | Status: AC
Start: 2012-07-06 — End: 2012-07-06
  Administered 2012-07-06: 8 mg via ORAL
  Filled 2012-07-06: qty 1

## 2012-07-06 NOTE — ED Notes (Signed)
Pt states she awoke and felt dizzy, ate a sandwich but then she felt worse, lightheaded, mild headache

## 2012-07-06 NOTE — ED Provider Notes (Signed)
History     CSN: [id number]  Arrival date & time 07/06/12  7829   First MD Initiated Contact with Patient 07/06/12 0451      Chief Complaint  Patient presents with  . Dizziness    (Consider location/radiation/quality/duration/timing/severity/associated sxs/prior treatment) HPI Laurie Macias is a 27 y.o. female who presents to the Emergency Department complaining of nervousness. She woke up feeling what she thought was hungry with mild diaphoresis, shakiness, a hollow  feeling in her stomach. She ate a sandwich and has not felt better. She continues to feel shakiness, mild nausea, nervous. Denies fever, chills, heart racing, vomiting. Past Medical History  Diagnosis Date  . Hydradenitis   . Polycystic ovarian disease   . Depression   . Chronic back pain   . Hypothyroid     Past Surgical History  Procedure Laterality Date  . Cholecystectomy    . Dilation and curettage of uterus      No family history on file.  History  Substance Use Topics  . Smoking status: Former Smoker -- 0.25 packs/day for 9 years    Types: Cigarettes    Quit date: 03/08/2012  . Smokeless tobacco: Never Used  . Alcohol Use: No    OB History   Grav Para Term Preterm Abortions TAB SAB Ect Mult Living   3    3  3          Review of Systems  Constitutional: Positive for diaphoresis. Negative for fever.       10 Systems reviewed and are negative for acute change except as noted in the HPI.  HENT: Negative for congestion.   Eyes: Negative for discharge and redness.  Respiratory: Negative for cough and shortness of breath.   Cardiovascular: Negative for chest pain.  Gastrointestinal: Negative for vomiting and abdominal pain.  Musculoskeletal: Negative for back pain.  Skin: Negative for rash.  Neurological: Positive for light-headedness. Negative for syncope, numbness and headaches.  Psychiatric/Behavioral:       No behavior change.    Allergies  Review of patient's allergies indicates no  known allergies.  Home Medications   Current Outpatient Rx  Name  Route  Sig  Dispense  Refill  . HYDROcodone-acetaminophen (NORCO/VICODIN) 5-325 MG per tablet      Take one-two tabs po q 4-6 hrs prn pain   15 tablet   0   . levothyroxine (SYNTHROID, LEVOTHROID) 300 MCG tablet   Oral   Take 300 mcg by mouth daily before breakfast.         . meloxicam (MOBIC) 15 MG tablet   Oral   Take 15 mg by mouth daily.         . traMADol (ULTRAM) 50 MG tablet   Oral   Take 50 mg by mouth every 8 (eight) hours as needed. For pain         . amoxicillin (AMOXIL) 500 MG capsule   Oral   Take 1 capsule (500 mg total) by mouth 3 (three) times daily.   21 capsule   0   . ibuprofen (ADVIL,MOTRIN) 200 MG tablet   Oral   Take 800 mg by mouth every 6 (six) hours as needed. For pain         . ondansetron (ZOFRAN ODT) 4 MG disintegrating tablet   Oral   Take 1 tablet (4 mg total) by mouth every 8 (eight) hours as needed for nausea.   20 tablet   0   . Pseudoeph-CPM-DM-APAP (THERAFLU FLU/COLD/COUGH PO)  Oral   Take 10 mLs by mouth every 8 (eight) hours as needed. For cough/congestion           BP 147/97  Pulse 115  Temp(Src) 98.2 F (36.8 C) (Oral)  Resp 22  Ht 5\' 3"  (1.6 m)  Wt 335 lb (151.955 kg)  BMI 59.36 kg/m2  SpO2 100%  Physical Exam  Nursing note and vitals reviewed. Constitutional:  Awake, alert, nontoxic appearance.  HENT:  Head: Normocephalic and atraumatic.  Right Ear: External ear normal.  Left Ear: External ear normal.  Eyes: EOM are normal. Pupils are equal, round, and reactive to light.  Neck: Neck supple.  Cardiovascular: Normal rate and intact distal pulses.   Pulmonary/Chest: Effort normal and breath sounds normal. She exhibits no tenderness.  Abdominal: Soft. Bowel sounds are normal. There is no tenderness. There is no rebound.  Musculoskeletal: She exhibits no tenderness.  Baseline ROM, no obvious new focal weakness.  Neurological:  Mental  status and motor strength appears baseline for patient and situation.  Skin: No rash noted.  Psychiatric:  Nervous, anxious    ED Course  Procedures (including critical care time) Results for orders placed during the hospital encounter of 07/06/12  GLUCOSE, CAPILLARY      Result Value Range   Glucose-Capillary 137 (*) 70 - 99 mg/dL     MDM  Patient with mild diaphoresis, stomach feeling hollow, Lightheadedness, and shakiness. She was given zofran and xanax with improvement. Patient is to follow up with her PCP for thyroid testing and with OB/GYN regarding irregular periods. Pt stable in ED with no significant deterioration in condition.The patient appears reasonably screened and/or stabilized for discharge and I doubt any other medical condition or other [facility] requiring further screening, evaluation, or treatment in the ED at this time prior to discharge.  MDM Reviewed: nursing note and vitals Interpretation: labs           Nicoletta Dress. Colon Branch, MD 07/06/12 1191

## 2013-07-03 ENCOUNTER — Encounter: Payer: Self-pay | Admitting: Obstetrics & Gynecology

## 2013-11-27 ENCOUNTER — Encounter (HOSPITAL_COMMUNITY): Payer: Self-pay | Admitting: Emergency Medicine

## 2013-11-27 ENCOUNTER — Emergency Department (HOSPITAL_COMMUNITY)
Admission: EM | Admit: 2013-11-27 | Discharge: 2013-11-27 | Disposition: A | Discharge status: Home / Self Care | Payer: Self-pay | Attending: Emergency Medicine | Admitting: Emergency Medicine

## 2013-11-27 ENCOUNTER — Emergency Department (HOSPITAL_COMMUNITY): Payer: Self-pay

## 2013-11-27 DIAGNOSIS — F3289 Other specified depressive episodes: Secondary | ICD-10-CM | POA: Insufficient documentation

## 2013-11-27 DIAGNOSIS — S8990XA Unspecified injury of unspecified lower leg, initial encounter: Secondary | ICD-10-CM | POA: Insufficient documentation

## 2013-11-27 DIAGNOSIS — Y939 Activity, unspecified: Secondary | ICD-10-CM | POA: Insufficient documentation

## 2013-11-27 DIAGNOSIS — Z79899 Other long term (current) drug therapy: Secondary | ICD-10-CM | POA: Insufficient documentation

## 2013-11-27 DIAGNOSIS — Z862 Personal history of diseases of the blood and blood-forming organs and certain disorders involving the immune mechanism: Secondary | ICD-10-CM | POA: Insufficient documentation

## 2013-11-27 DIAGNOSIS — S99919A Unspecified injury of unspecified ankle, initial encounter: Secondary | ICD-10-CM

## 2013-11-27 DIAGNOSIS — R296 Repeated falls: Secondary | ICD-10-CM | POA: Insufficient documentation

## 2013-11-27 DIAGNOSIS — Z872 Personal history of diseases of the skin and subcutaneous tissue: Secondary | ICD-10-CM | POA: Insufficient documentation

## 2013-11-27 DIAGNOSIS — S93609A Unspecified sprain of unspecified foot, initial encounter: Secondary | ICD-10-CM | POA: Insufficient documentation

## 2013-11-27 DIAGNOSIS — S99929A Unspecified injury of unspecified foot, initial encounter: Secondary | ICD-10-CM

## 2013-11-27 DIAGNOSIS — Z8639 Personal history of other endocrine, nutritional and metabolic disease: Secondary | ICD-10-CM | POA: Insufficient documentation

## 2013-11-27 DIAGNOSIS — E039 Hypothyroidism, unspecified: Secondary | ICD-10-CM | POA: Insufficient documentation

## 2013-11-27 DIAGNOSIS — Y929 Unspecified place or not applicable: Secondary | ICD-10-CM | POA: Insufficient documentation

## 2013-11-27 DIAGNOSIS — G8929 Other chronic pain: Secondary | ICD-10-CM | POA: Insufficient documentation

## 2013-11-27 DIAGNOSIS — S96911A Strain of unspecified muscle and tendon at ankle and foot level, right foot, initial encounter: Secondary | ICD-10-CM

## 2013-11-27 DIAGNOSIS — F17200 Nicotine dependence, unspecified, uncomplicated: Secondary | ICD-10-CM | POA: Insufficient documentation

## 2013-11-27 DIAGNOSIS — F329 Major depressive disorder, single episode, unspecified: Secondary | ICD-10-CM | POA: Insufficient documentation

## 2013-11-27 MED ORDER — HYDROCODONE-ACETAMINOPHEN 5-325 MG PO TABS: 1.0000 | ORAL_TABLET | Freq: Four times a day (QID) | ORAL | Status: DC | PRN

## 2013-11-27 NOTE — Discharge Instructions (Signed)
Foot Sprain The muscles and cord like structures which attach muscle to bone (tendons) that surround the feet are made up of units. A foot sprain can occur at the weakest spot in any of these units. This condition is most often caused by injury to or overuse of the foot, as from playing contact sports, or aggravating a previous injury, or from poor conditioning, or obesity. SYMPTOMS  Pain with movement of the foot.  Tenderness and swelling at the injury site.  Loss of strength is present in moderate or severe sprains. THE THREE GRADES OR SEVERITY OF FOOT SPRAIN ARE:  Mild (Grade I): Slightly pulled muscle without tearing of muscle or tendon fibers or loss of strength.  Moderate (Grade II): Tearing of fibers in a muscle, tendon, or at the attachment to bone, with small decrease in strength.  Severe (Grade III): Rupture of the muscle-tendon-bone attachment, with separation of fibers. Severe sprain requires surgical repair. Often repeating (chronic) sprains are caused by overuse. Sudden (acute) sprains are caused by direct injury or over-use. DIAGNOSIS  Diagnosis of this condition is usually by your own observation. If problems continue, a caregiver may be required for further evaluation and treatment. X-rays may be required to make sure there are not breaks in the bones (fractures) present. Continued problems may require physical therapy for treatment. PREVENTION  Use strength and conditioning exercises appropriate for your sport.  Warm up properly prior to working out.  Use athletic shoes that are made for the sport you are participating in.  Allow adequate time for healing. Early return to activities makes repeat injury more likely, and can lead to an unstable arthritic foot that can result in prolonged disability. Mild sprains generally heal in 3 to 10 days, with moderate and severe sprains taking 2 to 10 weeks. Your caregiver can help you determine the proper time required for  healing. HOME CARE INSTRUCTIONS   Apply ice to the injury for 15-20 minutes, 03-04 times per day. Put the ice in a plastic bag and place a towel between the bag of ice and your skin.  An elastic wrap (like an Ace bandage) may be used to keep swelling down.  Keep foot above the level of the heart, or at least raised on a footstool, when swelling and pain are present.  Try to avoid use other than gentle range of motion while the foot is painful. Do not resume use until instructed by your caregiver. Then begin use gradually, not increasing use to the point of pain. If pain does develop, decrease use and continue the above measures, gradually increasing activities that do not cause discomfort, until you gradually achieve normal use.  Use crutches if and as instructed, and for the length of time instructed.  Keep injured foot and ankle wrapped between treatments.  Massage foot and ankle for comfort and to keep swelling down. Massage from the toes up towards the knee.  Only take over-the-counter or prescription medicines for pain, discomfort, or fever as directed by your caregiver. SEEK IMMEDIATE MEDICAL CARE IF:   Your pain and swelling increase, or pain is not controlled with medications.  You have loss of feeling in your foot or your foot turns cold or blue.  You develop new, unexplained symptoms, or an increase of the symptoms that brought you to your caregiver. MAKE SURE YOU:   Understand these instructions.  Will watch your condition.  Will get help right away if you are not doing well or get worse. Document Released:   08/14/2001 Document Revised: 05/17/2011 Document Reviewed: 10/12/2007 Exit[employer] Patient Information 2015 [employer]. This information is not intended to replace advice given to you by your health care provider. Make sure you discuss any questions you have with your health care provider.  

## 2013-11-27 NOTE — ED Provider Notes (Signed)
CSN: [id number]     Arrival date & time 11/27/13  1501 History   First MD Initiated Contact with Patient 11/27/13 [phone]     Chief Complaint  Patient presents with  . Foot Pain     (Consider location/radiation/quality/duration/timing/severity/associated sxs/prior Treatment) HPI Comments: Larey Seat 2 days ago and now has pain to the top of the right foot with ambulation. Denies redness or swelling.  Patient is a 28 y.o. female presenting with lower extremity pain. The history is provided by the patient. No language interpreter was used.  Foot Pain This is a new problem. The current episode started yesterday. The problem occurs constantly. The problem has been unchanged. The symptoms are aggravated by walking. She has tried nothing for the symptoms.    Past Medical History  Diagnosis Date  . Hydradenitis   . Polycystic ovarian disease   . Depression   . Chronic back pain   . Hypothyroid    Past Surgical History  Procedure Laterality Date  . Cholecystectomy    . Dilation and curettage of uterus     History reviewed. No pertinent family history. History  Substance Use Topics  . Smoking status: Current Some Day Smoker -- 0.25 packs/day for 9 years    Types: Cigarettes    Last Attempt to Quit: 03/08/2012  . Smokeless tobacco: Never Used  . Alcohol Use: Yes     Comment: rarely   OB History   Grav Para Term Preterm Abortions TAB SAB Ect Mult Living   Review of Systems  Constitutional: Negative.   Respiratory: Negative.   Cardiovascular: Negative.       Allergies  Tramadol  Home Medications   Prior to Admission medications   Medication Sig Start Date End Date Taking? Authorizing Provider  ibuprofen (ADVIL,MOTRIN) 200 MG tablet Take 800 mg by mouth every 6 (six) hours as needed. For pain    Historical Provider, MD  levothyroxine (SYNTHROID, LEVOTHROID) 300 MCG tablet Take 300 mcg by mouth daily before breakfast.    Historical Provider, MD  meloxicam  (MOBIC) 15 MG tablet Take 15 mg by mouth daily.    Historical Provider, MD  traMADol (ULTRAM) 50 MG tablet Take 50 mg by mouth every 8 (eight) hours as needed. For pain    Historical Provider, MD   BP 150/84  Pulse 91  Temp(Src) 98.7 F (37.1 C) (Oral)  Resp 18  Ht  (1.6 m)  Wt 300 lb (136.079 kg)  BMI 53.16 kg/m2  SpO2 100%  LMP 11/13/2013 Physical Exam  Nursing note and vitals reviewed. Constitutional: She is oriented to person, place, and time. She appears well-developed and well-nourished.  Cardiovascular: Normal rate and regular rhythm.   Pulmonary/Chest: Effort normal and breath sounds normal.  Musculoskeletal:  Tender to the top of the right foot. Pulses intact. Has full rom  Neurological: She is alert and oriented to person, place, and time.  Skin: Skin is warm.    ED Course  Procedures (including critical care time) Labs Review Labs Reviewed - No data to display  Imaging Review Dg Foot Complete Right  11/27/2013   CLINICAL DATA:  Right foot pain after fall.  EXAM: RIGHT FOOT COMPLETE - 3+ VIEW  COMPARISON:  None.  FINDINGS: There is no evidence of fracture or dislocation. There is no evidence of arthropathy. Minimal spurring of posterior calcaneus is noted. Soft tissues are unremarkable.  IMPRESSION: No significant abnormality seen  in the right foot.   Electronically Signed   By: Roque Lias M.D.   On: 11/27/2013 15:40     EKG Interpretation None      MDM   Final diagnoses:  Right foot strain, initial encounter    No acute bony abnormality. Will give hydrocodone for apin    Teressa Lower, NP 11/27/13 1547

## 2013-11-27 NOTE — ED Notes (Signed)
Patient states she fell 2 days ago and hurt her right foot. States pain is worse.

## 2013-11-28 NOTE — ED Provider Notes (Signed)
Medical screening examination/treatment/procedure(s) were performed by non-physician practitioner and as supervising physician I was immediately available for consultation/collaboration.   EKG Interpretation None        Haneefah Venturini L Deantre Bourdon, MD 11/28/13 1550 

## 2014-01-07 ENCOUNTER — Encounter (HOSPITAL_COMMUNITY): Payer: Self-pay | Admitting: Emergency Medicine

## 2014-02-02 ENCOUNTER — Encounter (HOSPITAL_COMMUNITY): Payer: Self-pay | Admitting: Emergency Medicine

## 2014-02-02 ENCOUNTER — Emergency Department (HOSPITAL_COMMUNITY)
Admission: EM | Admit: 2014-02-02 | Discharge: 2014-02-02 | Disposition: A | Discharge status: Home / Self Care | Payer: Self-pay | Attending: Emergency Medicine | Admitting: Emergency Medicine

## 2014-02-02 DIAGNOSIS — Z872 Personal history of diseases of the skin and subcutaneous tissue: Secondary | ICD-10-CM | POA: Insufficient documentation

## 2014-02-02 DIAGNOSIS — Z79899 Other long term (current) drug therapy: Secondary | ICD-10-CM | POA: Insufficient documentation

## 2014-02-02 DIAGNOSIS — H6503 Acute serous otitis media, bilateral: Secondary | ICD-10-CM

## 2014-02-02 DIAGNOSIS — J039 Acute tonsillitis, unspecified: Secondary | ICD-10-CM

## 2014-02-02 DIAGNOSIS — F329 Major depressive disorder, single episode, unspecified: Secondary | ICD-10-CM | POA: Insufficient documentation

## 2014-02-02 DIAGNOSIS — G8929 Other chronic pain: Secondary | ICD-10-CM | POA: Insufficient documentation

## 2014-02-02 DIAGNOSIS — E039 Hypothyroidism, unspecified: Secondary | ICD-10-CM | POA: Insufficient documentation

## 2014-02-02 DIAGNOSIS — Z720 Tobacco use: Secondary | ICD-10-CM | POA: Insufficient documentation

## 2014-02-02 LAB — RAPID STREP SCREEN (MED CTR MEBANE ONLY): Streptococcus, Group A Screen (Direct): NEGATIVE

## 2014-02-02 MED ORDER — AMOXICILLIN 500 MG PO CAPS: 500.0000 mg | ORAL_CAPSULE | Freq: Three times a day (TID) | ORAL | Status: DC

## 2014-02-02 MED ORDER — HYDROCOD POLST-CHLORPHEN POLST 10-8 MG/5ML PO LQCR: 5.0000 mL | Freq: Two times a day (BID) | ORAL | Status: DC

## 2014-02-02 NOTE — ED Provider Notes (Signed)
CSN: [id number]     Arrival date & time 02/02/14  1331 History   First MD Initiated Contact with Patient 02/02/14 1405     Chief Complaint  Patient presents with  . Sore Throat     (Consider location/radiation/quality/duration/timing/severity/associated sxs/prior Treatment) Patient is a 28 y.o. female presenting with pharyngitis. The history is provided by the patient.  Sore Throat This is a new problem. The current episode started 1 to 4 weeks ago. The problem occurs constantly. The problem has been gradually worsening. Associated symptoms include chills, coughing, a fever, a sore throat and swollen glands. The symptoms are aggravated by swallowing and eating. She has tried NSAIDs for the symptoms.   Laurie Macias is a 28 y.o. female who presents to the ED with a sore throat that has been ongoing for the past 3 weeks. Over the past few days the pain has gotten worse. Ears hurt and feel full and sometimes feels a little dizzy. Hx of tonsillitis in the past.  Past Medical History  Diagnosis Date  . Hydradenitis   . Polycystic ovarian disease   . Depression   . Chronic back pain   . Hypothyroid    Past Surgical History  Procedure Laterality Date  . Cholecystectomy    . Dilation and curettage of uterus     History reviewed. No pertinent family history. History  Substance Use Topics  . Smoking status: Current Some Day Smoker -- 0.25 packs/day for 9 years    Types: Cigarettes    Last Attempt to Quit: 03/08/2012  . Smokeless tobacco: Never Used  . Alcohol Use: Yes     Comment: rarely   OB History    Gravida Para Term Preterm AB TAB SAB Ectopic Multiple Living   3    3  3         Review of Systems  Constitutional: Positive for fever and chills.  HENT: Positive for ear pain and sore throat.   Respiratory: Positive for cough.   all other systems negative    Allergies  Tramadol  Home Medications   Prior to Admission medications   Medication Sig Start Date End Date  Taking? Authorizing Provider  HYDROcodone-acetaminophen (NORCO/VICODIN) 5-325 MG per tablet Take 1-2 tablets by mouth every 6 (six) hours as needed. 11/27/13   Teressa LowerVrinda Pickering, NP  ibuprofen (ADVIL,MOTRIN) 200 MG tablet Take 800 mg by mouth every 6 (six) hours as needed. For pain    Historical Provider, MD  levothyroxine (SYNTHROID, LEVOTHROID) 300 MCG tablet Take 300 mcg by mouth daily before breakfast.    Historical Provider, MD  meloxicam (MOBIC) 15 MG tablet Take 15 mg by mouth daily.    Historical Provider, MD  traMADol (ULTRAM) 50 MG tablet Take 50 mg by mouth every 8 (eight) hours as needed. For pain    Historical Provider, MD   BP 139/86 mmHg  Pulse 88  Temp(Src) 98.6 F (37 C) (Oral)  Resp 18  Ht 5\' 3"  (1.6 m)  Wt 315 lb (142.883 kg)  BMI 55.81 kg/m2  SpO2 100%  LMP 01/12/2014 Physical Exam  Constitutional: She is oriented to person, place, and time. She appears well-developed and well-nourished.  HENT:  Head: Normocephalic.  Right Ear: Tympanic membrane is erythematous.  Left Ear: Tympanic membrane is erythematous.  Nose: Rhinorrhea present.  Mouth/Throat: Uvula is midline and mucous membranes are normal. Oropharyngeal exudate and posterior oropharyngeal erythema present.  Tonsils enlarged with exudate right.   Eyes: EOM are normal.  Neck: Normal range  of motion. Neck supple.  Cardiovascular: Normal rate and regular rhythm.   Pulmonary/Chest: Effort normal. She has no wheezes. She has no rales.  Abdominal: Soft. There is no tenderness.  Musculoskeletal: Normal range of motion.  Lymphadenopathy:    She has cervical adenopathy.  Neurological: She is alert and oriented to person, place, and time. No cranial nerve deficit.  Skin: Skin is warm and dry.  Psychiatric: She has a normal mood and affect. Her behavior is normal.  Nursing note and vitals reviewed.   ED Course  Procedures (including critical care time) Labs Review  MDM  28 y.o. female with sore throat,  enlarged tonsils with exudate and bilateral otitis media. Will treat with antibiotics and pain medications. She will follow up with her PCP or return here for worsening symptoms. Discussed with the patient clinical findings and plan of care and all questioned fully answered.    Medication List    TAKE these medications        amoxicillin 500 MG capsule  Commonly known as:  AMOXIL  Take 1 capsule (500 mg total) by mouth 3 (three) times daily.     chlorpheniramine-HYDROcodone 10-8 MG/5ML Lqcr  Commonly known as:  TUSSIONEX PENNKINETIC ER  Take 5 mLs by mouth 2 (two) times daily.      ASK your doctor about these medications        HYDROcodone-acetaminophen 5-325 MG per tablet  Commonly known as:  NORCO/VICODIN  Take 1-2 tablets by mouth every 6 (six) hours as needed.     ibuprofen 200 MG tablet  Commonly known as:  ADVIL,MOTRIN  Take 800 mg by mouth every 6 (six) hours as needed. For pain     levothyroxine 300 MCG tablet  Commonly known as:  SYNTHROID, LEVOTHROID  Take 300 mcg by mouth daily before breakfast.     meloxicam 15 MG tablet  Commonly known as:  MOBIC  Take 15 mg by mouth daily.     traMADol 50 MG tablet  Commonly known as:  ULTRAM  Take 50 mg by mouth every 8 (eight) hours as needed. For pain            [facility] Lita Flynn, NP 02/02/14 1733  Audree CamelScott T Goldston, MD 02/06/14 1016

## 2014-02-02 NOTE — ED Notes (Signed)
Had soret throat for 3 weeks.  Pain has increased for past couple days.  Rates pain 7.  Having difficulty swallowing.

## 2014-02-04 LAB — CULTURE, GROUP A STREP

## 2014-02-19 ENCOUNTER — Encounter (HOSPITAL_COMMUNITY): Payer: Self-pay | Admitting: *Deleted

## 2014-02-19 ENCOUNTER — Emergency Department (HOSPITAL_COMMUNITY)
Admission: EM | Admit: 2014-02-19 | Discharge: 2014-02-19 | Disposition: A | Discharge status: Home / Self Care | Payer: Self-pay | Attending: Emergency Medicine | Admitting: Emergency Medicine

## 2014-02-19 DIAGNOSIS — G8929 Other chronic pain: Secondary | ICD-10-CM | POA: Insufficient documentation

## 2014-02-19 DIAGNOSIS — Z8639 Personal history of other endocrine, nutritional and metabolic disease: Secondary | ICD-10-CM | POA: Insufficient documentation

## 2014-02-19 DIAGNOSIS — Z872 Personal history of diseases of the skin and subcutaneous tissue: Secondary | ICD-10-CM | POA: Insufficient documentation

## 2014-02-19 DIAGNOSIS — E039 Hypothyroidism, unspecified: Secondary | ICD-10-CM | POA: Insufficient documentation

## 2014-02-19 DIAGNOSIS — Z720 Tobacco use: Secondary | ICD-10-CM | POA: Insufficient documentation

## 2014-02-19 DIAGNOSIS — Z8659 Personal history of other mental and behavioral disorders: Secondary | ICD-10-CM | POA: Insufficient documentation

## 2014-02-19 DIAGNOSIS — L298 Other pruritus: Secondary | ICD-10-CM | POA: Insufficient documentation

## 2014-02-19 DIAGNOSIS — T887XXA Unspecified adverse effect of drug or medicament, initial encounter: Secondary | ICD-10-CM | POA: Insufficient documentation

## 2014-02-19 DIAGNOSIS — T7840XA Allergy, unspecified, initial encounter: Secondary | ICD-10-CM

## 2014-02-19 MED ORDER — HYDROXYZINE HCL 25 MG PO TABS: 25.0000 mg | ORAL_TABLET | Freq: Four times a day (QID) | ORAL | Status: DC

## 2014-02-19 MED ORDER — DIPHENHYDRAMINE HCL 50 MG/ML IJ SOLN
25.0000 mg | Freq: Once | INTRAMUSCULAR | Status: AC
  Administered 2014-02-19: 25 mg via INTRAMUSCULAR
  Filled 2014-02-19: qty 1

## 2014-02-19 NOTE — ED Notes (Signed)
Pt states she has been itching all over for days and she feel she is going to scratch her skin off.

## 2014-02-19 NOTE — Discharge Instructions (Signed)
You have been diagnosed with an allergic reaction. Usually allergic reactions like this are caused by exposures to something that you either ate or touched or smelled.  It may be related to a number of different exposures including a new perfume, topical creams, soaps, detergents, linens, clothing, medications. Occasionally we do not find an answer for why there is an allergic reaction. These are treated the same way including Benadryl as needed for itching and rash. (This can be used up to 50 mg every 6 hours as needed).  Pepcid 20 mg every night and prednisone once a day for 5 days. Please do not take the Benadryl and drive or take care of children or other imported duties as the Benadryl can make you sleepy.   ° °If you should develop severe or worsening symptoms including difficulty breathing, difficulty swallowing, wheezing or increased coughing or a rash that developed on the inside of your mouth or a worsening rash on your skin, return to the hospital immediately for a recheck. Please call your Dr. in the morning for a recheck in 2 days if you are still having symptoms. If you do not have a Dr. see the list below.  If we have identified the source of your allergic reaction, please avoid this at all costs. This means stopping the medication if it is a new medication or a voiding topical exposures such as creams lotions body soaps or deodorants if this is the source. ° °Allergic Reaction, Mild to Moderate °Allergies may happen from anything your body is sensitive to. This may be food, medications, pollens, chemicals, and nearly anything around you in everyday life that produces allergens. An allergen is anything that causes an allergy producing substance. Allergens cause your body to release allergic antibodies. Through a chain of events, they cause a release of histamine into the blood stream. Histamines are meant to protect you, but they also cause your discomfort. This is why antihistamines are often used  for allergies. Heredity is often a factor in causing allergic reactions. This means you may have some of the same allergies as your parents. °Allergies happen in all age groups. You may have some idea of what caused your reaction. There are many allergens around us. It may be difficult to know what caused your reaction. If this is a first time event, it may never happen again. Allergies cannot be cured but can be controlled with medications. °SYMPTOMS  °You may get some or all of the following problems from allergies. °· Swelling and itching in and around the mouth.  °· Tearing, itchy eyes.  °· Nasal congestion and runny nose.  °· Sneezing and coughing.  °· An itchy red rash or hives.  °· Vomiting or diarrhea.  °· Difficulty breathing.  °Seasonal allergies occur in all age groups. They are seasonal because they usually occur during the same season every year. They may be a reaction to molds, grass pollens, or tree pollens. Other causes of allergies are house dust mite allergens, pet dander and mold spores. These are just a common few of the thousands of allergens around us. All of the symptoms listed above happen when you come in contact with pollens and other allergens. Seasonal allergies are usually not life threatening. They are generally more of a nuisance that can often be handled using medications. °Hay fever is a combination of all or some of the above listed allergy problems. It may often be treated with simple over-the-counter medications such as diphenhydramine. Take medication as   directed. Check with your caregiver or package insert for child dosages. °TREATMENT AND HOME CARE INSTRUCTIONS °If hives or rash are present: °· Take medications as directed.  °· You may use an over-the-counter antihistamine (diphenhydramine) for hives and itching as needed. Do not drive or drink alcohol until medications used to treat the reaction have worn off. Antihistamines tend to make people sleepy.  °· Apply cold cloths  (compresses) to the skin or take baths in cool water. This will help itching. Avoid hot baths or showers. Heat will make a rash and itching worse.  °· If your allergies persist and become more severe, and over the counter medications are not effective, there are many new medications your caretaker can prescribe. Immunotherapy or desensitizing injections can be used if all else fails. Follow up with your caregiver if problems continue.  °SEEK MEDICAL CARE IF:  °· Your allergies are becoming progressively more troublesome.  °· You suspect a food allergy. Symptoms generally happen within 30 minutes of eating a food.  °· Your symptoms have not gone away within 2 days or are getting worse.  °· You develop new symptoms.  °· You want to retest yourself or your child with a food or drink you think causes an allergic reaction. Never test yourself or your child of a suspected allergy without being under the watchful eye of your caregivers. A second exposure to an allergen may be life-threatening.  °SEEK IMMEDIATE MEDICAL CARE IF: °· You develop difficulty breathing or wheezing, or have a tight feeling in your chest or throat.  °· You develop a swollen mouth, hives, swelling, or itching all over your body.  °A severe reaction with any of the above problems should be considered life-threatening. If you suddenly develop difficulty breathing call for local emergency medical help. THIS IS AN EMERGENCY. °MAKE SURE YOU:  °· Understand these instructions.  °· Will watch your condition.  °· Will get help right away if you are not doing well or get worse.  °Document Released: 12/20/2006 Document Revised: 02/11/2011 Document Reviewed: 12/20/2006 °ExitCare® Patient Information ©2012 [employer]. ° °RESOURCE GUIDE ° °Chronic Pain Problems: °Contact [facility]  [phone] °Patients need to be referred by their primary care doctor. ° °Insufficient Money for Medicine: °Contact [employer] Way:  call "211" or [facility] 271-5999. ° °No Primary Care Doctor: °- Call [facility]  832-8000 - can help you locate a primary care doctor that  accepts your insurance, provides certain services, etc. °- Physician Referral Service- 1-800-533-3463 ° °Agencies that provide inexpensive medical care: °- [facility]  832-8035 °- [facility]  832-7272 °- [facility]  [phone] °- [facility]  832-4777 °- [facility]  373-0678 °- [facility]  [phone] ° °Medicaid-accepting [place] Providers: °- [facility]- [address] ° [phone], Mon-Fri 9am-7pm, Sat 9am-1pm °- [facility]- [address], [address] ° [phone] °- [facility]- [address], [address] ° [phone] °- [facility]- [address] [place] [address] ° [phone] °- [facility]- [address], [address], [phone] ° Only accepts [place] Access Medicaid patients after they have their name  applied to their card ° °Self Pay (no insurance) in [place]: °- Sickle Cell Patients: Dr Eric Dean, [facility] ° [address], [phone] °-  [facility]- [address] °   [phone] °      -     [facility]- [address] °      -     [facility]- see information above (Speak to Pam H if you do not have insurance) °      -  [facility]- [address], [phone] °      -  [facility] [place]- [address],  [phone] °      -  [facility]- [address] [place] [address], [phone] °      -  Dr Osei-Bonsu-  [address], [place], [phone] °      -  [facility]- [address], [phone] °      -  [facility]- [address] [place] [address], [phone], also [address], [phone] °      -    [facility]- [address], [phone], 1st & 3rd Saturday   every month,  10am-1pm ° °1) Find a Doctor and Pay Out of Pocket °Although you won't have to find out who is covered by your insurance plan, it is a good idea to ask around and get recommendations. You will then need to call the office and see if the doctor you have chosen will accept you as a new patient and what types of options they offer for patients who are self-pay. Some doctors offer discounts or will set up payment plans for their patients who do not have insurance, but you will need to ask so you aren't surprised when you get to your appointment. ° °2) Contact Your Local Health Department °Not all health departments have doctors that can see patients for sick visits, but many do, so it is worth a call to see if yours does. If you don't know where your local health department is, you can check in your phone book. The CDC also has a tool to help you locate your state's health department, and many state websites also have listings of all of their local health departments. ° °3) Find a Walk-in Clinic °If your illness is not likely to be very severe or complicated, you may want to try a walk in clinic. These are popping up all over the country in pharmacies, drugstores, and shopping centers. They're usually staffed by nurse practitioners or physician assistants that have been trained to treat common illnesses and complaints. They're usually fairly quick and inexpensive. However, if you have serious medical issues or chronic medical problems, these are probably not your best option ° °STD Testing °- [place] [facility] [place], [facility], [address], [place], phone [phone] or 1-877-539-9860.  Monday - Friday, call for an appointment. °- [place] [facility] [place], [facility], [address], [place], phone [phone] or 1-877-539-9860.  Monday - Friday, call for an appointment. ° °Abuse/Neglect: °- [place] Child Abuse Hotline (336)  641-3795 °- [place] Child Abuse Hotline 800-378-5315 (After Hours) ° °Emergency Shelter:  [facility] (336) 271-5985 ° °Maternity Homes: °- [facility] (336) 275-9566 °- [facility] (704) 372-4663 ° °MRSA Hotline #:   832-7006 ° °[place] Resources ° °[facility] [place]  [employer] [place] [facility] °[address]                   [address]         [address]  °[place]                                               [place]                              [place] °Phone:  349-3220                                  Phone:  342-7768                   Phone:  342-8140 ° °[facility], 342-8316 °- [place] [facility] - [facility]- 1-888-581-9988 °      -     [facility] in , [address],                                  336-349-4454, Insurance ° °[place] Child Abuse Hotline °(336) 342-1394 or (336) 342-3537 (After Hours) ° ° °Behavioral Health Services ° °Substance Abuse Resources: °- [facility]  336-882-2125 °- [facility] 336-784-9470 °- [facility] 336-285-9073 °- [facility] 336-845-3988 °- Residential & Outpatient Substance Abuse Program  800-659-3381 ° °Psychological Services: °- [facility]  832-9600 °- [facility]  378-7881 °- [facility], [address], [place], ACCESS LINE: 1-800-853-5163 or 336-641-4981, Http://[email or web address]/services/adult.htm ° °Dental Assistance ° °If unable to pay or uninsured, contact:  [facility] or [place] [facility] to become qualified for the adult dental clinic. ° °Patients with Medicaid: [facility] °[address], [phone] °[address], [phone] ° °If unable to pay, or uninsured, contact [facility] (271-5999) or [place] [facility] (641-3152 in [place], 842-7733 in [place]) to become qualified for the adult dental clinic ° °Other Low-Cost Community Dental Services: °- [facility]- [address], [place], [place], [address], 723-1848, Ext. [address], 2nd and 4th Thursday of the month at 6:30am.  10 clients each day by appointment, can sometimes see walk-in patients if someone does not show for an appointment. °- [facility]- [address], [place], [place], [address], 723-7904 °- [facility]- [address], [place], [place], [address], [phone] °- [facility]- 342-8273 °- [facility]- 703-3100 °- [facility]- 570-6415 ° ° ° ° ° °

## 2014-02-19 NOTE — ED Provider Notes (Signed)
CSN: [id number]     Arrival date & time 02/19/14  0503 History  This chart was scribed for Vida RollerBrian D Orvilla Truett, MD by Tonye RoyaltyJoshua Chen, ED Scribe. This patient was seen in room APA02/APA02 and the patient's care was started at 7:11 AM.    Chief Complaint  Patient presents with  . Pruritis   The history is provided by the patient. No language interpreter was used.    Symptoms were acute in onset Symptoms are persistent Symptoms are improved with nothing Made worse with nothing Denies other symptoms  HPI Comments: Laurie Macias is a 28 y.o. female who presents to the Emergency Department complaining of itching diffusely. She suspects it is due to Tussionex which she started using 2 day ago for URI; she notes she has allergies to many pain medications but has not had problem taking Tussionex before. She notes associated ear popping, She states she has used Benadyl and Claritin last night. She states she feels "spacey and sick" when she uses Prednisone. She denies fever. . Past Medical History  Diagnosis Date  . Hydradenitis   . Polycystic ovarian disease   . Depression   . Chronic back pain   . Hypothyroid    Past Surgical History  Procedure Laterality Date  . Cholecystectomy    . Dilation and curettage of uterus     History reviewed. No pertinent family history. History  Substance Use Topics  . Smoking status: Current Some Day Smoker -- 0.25 packs/day for 9 years    Types: Cigarettes    Last Attempt to Quit: 03/08/2012  . Smokeless tobacco: Never Used  . Alcohol Use: Yes     Comment: rarely   OB History    Gravida Para Term Preterm AB TAB SAB Ectopic Multiple Living   3    3  3         Review of Systems  Constitutional: Negative for fever.  HENT:       Ear popping  Skin:       itching      Allergies  Tramadol  Home Medications   Prior to Admission medications   Medication Sig Start Date End Date Taking? Authorizing Provider  levothyroxine (SYNTHROID, LEVOTHROID)  300 MCG tablet Take 300 mcg by mouth daily before breakfast.   Yes Historical Provider, MD  meloxicam (MOBIC) 15 MG tablet Take 15 mg by mouth daily.   Yes Historical Provider, MD  amoxicillin (AMOXIL) 500 MG capsule Take 1 capsule (500 mg total) by mouth 3 (three) times daily. Patient not taking: Reported on 02/19/2014 02/02/14   Janne NapoleonHope M Neese, NP  chlorpheniramine-HYDROcodone [facility](TUSSIONEX PENNKINETIC ER) 10-8 MG/5ML LQCR Take 5 mLs by mouth 2 (two) times daily. Patient not taking: Reported on 02/19/2014 02/02/14   Janne NapoleonHope M Neese, NP  HYDROcodone-acetaminophen (NORCO/VICODIN) 5-325 MG per tablet Take 1-2 tablets by mouth every 6 (six) hours as needed. 11/27/13   Teressa LowerVrinda Pickering, NP  hydrOXYzine (ATARAX/VISTARIL) 25 MG tablet Take 1 tablet (25 mg total) by mouth every 6 (six) hours. 02/19/14   Vida RollerBrian D Kelissa Merlin, MD  ibuprofen (ADVIL,MOTRIN) 200 MG tablet Take 800 mg by mouth every 6 (six) hours as needed. For pain    Historical Provider, MD   BP 114/90 mmHg  Pulse 126  Temp(Src) 98 F (36.7 C) (Oral)  Resp 20  Ht 5\' 3"  (1.6 m)  Wt 310 lb (140.615 kg)  BMI 54.93 kg/m2  SpO2 100%  LMP 01/12/2014 Physical Exam  Constitutional: She appears well-developed and well-nourished.  No distress.  HENT:  Head: Normocephalic and atraumatic.  Mouth/Throat: Oropharynx is clear and moist. No oropharyngeal exudate.  No intra-oral lesions Left and right TMs retracted  Eyes: Conjunctivae and EOM are normal. Pupils are equal, round, and reactive to light. Right eye exhibits no discharge. Left eye exhibits no discharge. No scleral icterus.  Neck: Normal range of motion. Neck supple. No JVD present. No thyromegaly present.  Cardiovascular: Normal rate, regular rhythm, normal heart sounds and intact distal pulses.  Exam reveals no gallop and no friction rub.   No murmur heard. Pulmonary/Chest: Effort normal and breath sounds normal. No respiratory distress. She has no wheezes. She has no rales.  Abdominal: Soft.  Bowel sounds are normal. She exhibits no distension and no mass. There is no tenderness.  Musculoskeletal: Normal range of motion. She exhibits no edema or tenderness.  Lymphadenopathy:    She has no cervical adenopathy.  Neurological: She is alert. Coordination normal.  Skin: Skin is warm and dry. No rash noted. No erythema.  Psychiatric: She has a normal mood and affect. Her behavior is normal.  Nursing note and vitals reviewed.   ED Course  Procedures (including critical care time)  DIAGNOSTIC STUDIES: Oxygen Saturation is 100% on room air, normal by my interpretation.    COORDINATION OF CARE: 7:14 AM Discussed treatment plan with patient at beside, the patient agrees with the plan and has no further questions at this time.  Patient declines Prednisone due to previous allergy.  Labs Review Labs Reviewed - No data to display  Imaging Review No results found.    MDM   Final diagnoses:  Allergic reaction to drug    Well appaering, no signs of bullae / oral lesions, no SJ syndrome, no urticaria, likely opiate reaction - encouraged to d/c cough meds, pt in agreement.  Meds given in ED:  Medications  diphenhydrAMINE (BENADRYL) injection 25 mg (25 mg Intramuscular Given 02/19/14 0735)    Discharge Medication List as of 02/19/2014  7:55 AM    START taking these medications   Details  hydrOXYzine (ATARAX/VISTARIL) 25 MG tablet Take 1 tablet (25 mg total) by mouth every 6 (six) hours., Starting 02/19/2014, Until Discontinued, Print          I personally performed the services described in this documentation, which was scribed in my presence. The recorded information has been reviewed and is accurate.  Vida RollerBrian D Darlisa Spruiell, MD 02/19/14 [phone]

## 2014-02-28 ENCOUNTER — Encounter (HOSPITAL_COMMUNITY): Payer: Self-pay

## 2014-02-28 ENCOUNTER — Emergency Department (HOSPITAL_COMMUNITY)
Admission: EM | Admit: 2014-02-28 | Discharge: 2014-02-28 | Disposition: A | Discharge status: Home / Self Care | Payer: Self-pay | Attending: Emergency Medicine | Admitting: Emergency Medicine

## 2014-02-28 DIAGNOSIS — G8929 Other chronic pain: Secondary | ICD-10-CM | POA: Insufficient documentation

## 2014-02-28 DIAGNOSIS — F419 Anxiety disorder, unspecified: Secondary | ICD-10-CM

## 2014-02-28 DIAGNOSIS — Z79899 Other long term (current) drug therapy: Secondary | ICD-10-CM | POA: Insufficient documentation

## 2014-02-28 DIAGNOSIS — E079 Disorder of thyroid, unspecified: Secondary | ICD-10-CM | POA: Insufficient documentation

## 2014-02-28 DIAGNOSIS — Z720 Tobacco use: Secondary | ICD-10-CM | POA: Insufficient documentation

## 2014-02-28 DIAGNOSIS — Z3202 Encounter for pregnancy test, result negative: Secondary | ICD-10-CM | POA: Insufficient documentation

## 2014-02-28 DIAGNOSIS — F310 Bipolar disorder, current episode hypomanic: Secondary | ICD-10-CM | POA: Insufficient documentation

## 2014-02-28 DIAGNOSIS — Z872 Personal history of diseases of the skin and subcutaneous tissue: Secondary | ICD-10-CM | POA: Insufficient documentation

## 2014-02-28 LAB — CBC WITH DIFFERENTIAL/PLATELET
Basophils Absolute: 0 10*3/uL (ref 0.0–0.1)
Basophils Relative: 0 % (ref 0–1)
Eosinophils Absolute: 0.2 10*3/uL (ref 0.0–0.7)
Eosinophils Relative: 2 % (ref 0–5)
HCT: 41.6 % (ref 36.0–46.0)
HEMOGLOBIN: 13.9 g/dL (ref 12.0–15.0)
LYMPHS ABS: 2.7 10*3/uL (ref 0.7–4.0)
LYMPHS PCT: 31 % (ref 12–46)
MCH: 29.2 pg (ref 26.0–34.0)
MCHC: 33.4 g/dL (ref 30.0–36.0)
MCV: 87.4 fL (ref 78.0–100.0)
MONOS PCT: 8 % (ref 3–12)
Monocytes Absolute: 0.7 10*3/uL (ref 0.1–1.0)
NEUTROS PCT: 59 % (ref 43–77)
Neutro Abs: 5.2 10*3/uL (ref 1.7–7.7)
PLATELETS: 275 10*3/uL (ref 150–400)
RBC: 4.76 MIL/uL (ref 3.87–5.11)
RDW: 13.2 % (ref 11.5–15.5)
WBC: 8.8 10*3/uL (ref 4.0–10.5)

## 2014-02-28 LAB — BASIC METABOLIC PANEL
Anion gap: 4 — ABNORMAL LOW (ref 5–15)
BUN: 5 mg/dL — AB (ref 6–23)
CO2: 31 mmol/L (ref 19–32)
Calcium: 8.9 mg/dL (ref 8.4–10.5)
Chloride: 103 mEq/L (ref 96–112)
Creatinine, Ser: 0.56 mg/dL (ref 0.50–1.10)
GFR calc Af Amer: >90 mL/min (ref >90)
GFR calc non Af Amer: >90 mL/min (ref >90)
GLUCOSE: 120 mg/dL — AB (ref 70–99)
POTASSIUM: 3.7 mmol/L (ref 3.5–5.1)
SODIUM: 138 mmol/L (ref 135–145)

## 2014-02-28 LAB — URINALYSIS, ROUTINE W REFLEX MICROSCOPIC
Bilirubin Urine: NEGATIVE
GLUCOSE, UA: NEGATIVE mg/dL
HGB URINE DIPSTICK: NEGATIVE
Ketones, ur: NEGATIVE mg/dL
LEUKOCYTES UA: NEGATIVE
Nitrite: NEGATIVE
PROTEIN: NEGATIVE mg/dL
Specific Gravity, Urine: 1.015 (ref 1.005–1.030)
Urobilinogen, UA: 0.2 mg/dL (ref 0.0–1.0)
pH: 7.5 (ref 5.0–8.0)

## 2014-02-28 LAB — POC URINE PREG, ED: Preg Test, Ur: NEGATIVE

## 2014-02-28 MED ORDER — IBUPROFEN 800 MG PO TABS
800.0000 mg | ORAL_TABLET | Freq: Once | ORAL | Status: AC
  Administered 2014-02-28: 800 mg via ORAL
  Filled 2014-02-28: qty 1

## 2014-02-28 MED ORDER — DIAZEPAM 5 MG PO TABS: 5.0000 mg | ORAL_TABLET | Freq: Two times a day (BID) | ORAL | Status: DC

## 2014-02-28 MED ORDER — LORAZEPAM 2 MG/ML IJ SOLN
2.0000 mg | Freq: Once | INTRAMUSCULAR | Status: AC
  Administered 2014-02-28: 2 mg via INTRAMUSCULAR

## 2014-02-28 MED ORDER — LORAZEPAM 2 MG/ML IJ SOLN
INTRAMUSCULAR | Status: AC
  Filled 2014-02-28: qty 1

## 2014-02-28 NOTE — Discharge Instructions (Signed)
Panic Attacks °Panic attacks are sudden, short feelings of great fear or discomfort. You may have them for no reason when you are relaxed, when you are uneasy (anxious), or when you are sleeping.  °HOME CARE °· Take all your medicines as told. °· Check with your doctor before starting new medicines. °· Keep all doctor visits. °GET HELP IF: °· You are not able to take your medicines as told. °· Your symptoms do not get better. °· Your symptoms get worse. °GET HELP RIGHT AWAY IF: °· Your attacks seem different than your normal attacks. °· You have thoughts about hurting yourself or others. °· You take panic attack medicine and you have a side effect. °MAKE SURE YOU: °· Understand these instructions. °· Will watch your condition. °· Will get help right away if you are not doing well or get worse. °Document Released: 03/27/2010 Document Revised: 12/13/2012 Document Reviewed: 10/06/2012 °ExitCare® Patient Information ©2015 [employer]. This information is not intended to replace advice given to you by your health care provider. Make sure you discuss any questions you have with your health care provider. ° °

## 2014-02-28 NOTE — ED Provider Notes (Signed)
CSN: [id number]     Arrival date & time 02/28/14  [id number] History   First MD Initiated Contact with Patient 02/28/14 [phone]     Chief Complaint  Patient presents with  . Anxiety     (Consider location/radiation/quality/duration/timing/severity/associated sxs/prior Treatment) HPI   Laurie Macias is a 28 y.o. female who presents to the Emergency Department complaining of feelings of anxiousness that have been escalating since October and much worse for 1-2 weeks.  She states that she feels unsafe at home due to stress of her family members arguing constantly but denies being threatened herself.  She c/o feeling dizzy with movement, throbbing frontal headache, difficulty sleeping, nausea and heart racing.  [facility] [place] 3 days ago and started back on Celexa 20 mg once daily and trazodone at night.  She states that she works third shift and has taken her trazodone this morning, but was still unable to sleep.  She denies suicidal or homicidal thoughts or plan, shortness of breath, fever, abdominal pain, alcohol or use of cocaine, marijuana or other street drugs.     Past Medical History  Diagnosis Date  . Hydradenitis   . Polycystic ovarian disease   . Depression   . Chronic back pain   . Hypothyroid    Past Surgical History  Procedure Laterality Date  . Cholecystectomy    . Dilation and curettage of uterus     No family history on file. History  Substance Use Topics  . Smoking status: Current Some Day Smoker -- 0.25 packs/day for 9 years    Types: Cigarettes    Last Attempt to Quit: 03/08/2012  . Smokeless tobacco: Never Used  . Alcohol Use: Yes     Comment: rarely   OB History    Gravida Para Term Preterm AB TAB SAB Ectopic Multiple Living   3    3  3         Review of Systems  Constitutional: Negative for fever, chills and fatigue.  HENT: Negative for sore throat and trouble swallowing.   Respiratory: Negative for cough, shortness of breath and  wheezing.   Cardiovascular: Negative for chest pain and palpitations.  Gastrointestinal: Negative for nausea, vomiting, abdominal pain and blood in stool.  Endocrine: Negative for polydipsia and polyuria.  Genitourinary: Negative for dysuria, hematuria and flank pain.  Musculoskeletal: Negative for myalgias, back pain, arthralgias, neck pain and neck stiffness.  Skin: Negative for rash.  Neurological: Positive for dizziness and headaches. Negative for syncope, facial asymmetry, speech difficulty, weakness, light-headedness and numbness.  Hematological: Does not bruise/bleed easily.  Psychiatric/Behavioral: Positive for sleep disturbance. Negative for suicidal ideas, hallucinations, confusion and decreased concentration.  All other systems reviewed and are negative.     Allergies  Tramadol  Home Medications   Prior to Admission medications   Medication Sig Start Date End Date Taking? Authorizing Provider  amoxicillin (AMOXIL) 500 MG capsule Take 1 capsule (500 mg total) by mouth 3 (three) times daily. Patient not taking: Reported on 02/19/2014 02/02/14   Janne NapoleonHope M Neese, NP  chlorpheniramine-HYDROcodone [facility](TUSSIONEX PENNKINETIC ER) 10-8 MG/5ML LQCR Take 5 mLs by mouth 2 (two) times daily. Patient not taking: Reported on 02/19/2014 02/02/14   Janne NapoleonHope M Neese, NP  HYDROcodone-acetaminophen (NORCO/VICODIN) 5-325 MG per tablet Take 1-2 tablets by mouth every 6 (six) hours as needed. 11/27/13   Teressa LowerVrinda Pickering, NP  hydrOXYzine (ATARAX/VISTARIL) 25 MG tablet Take 1 tablet (25 mg total) by mouth every 6 (six) hours. 02/19/14  Vida RollerBrian D Miller, MD  ibuprofen (ADVIL,MOTRIN) 200 MG tablet Take 800 mg by mouth every 6 (six) hours as needed. For pain    Historical Provider, MD  levothyroxine (SYNTHROID, LEVOTHROID) 300 MCG tablet Take 300 mcg by mouth daily before breakfast.    Historical Provider, MD  meloxicam (MOBIC) 15 MG tablet Take 15 mg by mouth daily.    Historical Provider, MD   BP 149/98 mmHg   Pulse 107  Temp(Src) 99 F (37.2 C) (Oral)  Resp 20  Ht 5\' 3"  (1.6 m)  Wt 320 lb (145.151 kg)  BMI 56.70 kg/m2  SpO2 98%  LMP 02/11/2014 Physical Exam  Constitutional: She is oriented to person, place, and time. She appears well-developed and well-nourished. No distress.  HENT:  Head: Normocephalic and atraumatic.  Mouth/Throat: Oropharynx is clear and moist.  Eyes: Conjunctivae and EOM are normal. Pupils are equal, round, and reactive to light.  Neck: Normal range of motion, full passive range of motion without pain and phonation normal. Neck supple. No Kernig's sign noted. No thyroid mass present.  Cardiovascular: Normal rate, regular rhythm, normal heart sounds and intact distal pulses.   No murmur heard. Pulmonary/Chest: Effort normal and breath sounds normal. No respiratory distress. She exhibits no tenderness.  Abdominal: Soft. She exhibits no distension. There is no tenderness. There is no rebound and no guarding.  Musculoskeletal: Normal range of motion.  Lymphadenopathy:    She has no cervical adenopathy.  Neurological: She is alert and oriented to person, place, and time. No cranial nerve deficit. She exhibits normal muscle tone.  Skin: Skin is warm and dry.  Psychiatric: She has a normal mood and affect. Her behavior is normal. Thought content normal.  Nursing note and vitals reviewed.   ED Course  Procedures (including critical care time) Labs Review Labs Reviewed  BASIC METABOLIC PANEL - Abnormal; Notable for the following:    Glucose, Bld 120 (*)    BUN 5 (*)    Anion gap 4 (*)    All other components within normal limits  CBC WITH DIFFERENTIAL  URINALYSIS, ROUTINE W REFLEX MICROSCOPIC  POC URINE PREG, ED    Imaging Review No results found.   EKG Interpretation   Date/Time:  Thursday February 28 2014 08:53:53 EST Ventricular Rate:  111 PR Interval:  153 QRS Duration: 81 QT Interval:  368 QTC Calculation: 500 R Axis:   32 Text Interpretation:   Sinus tachycardia Prolonged QT interval No old  tracing to compare Confirmed by [facility]  MD, Nicholos JohnsKATHLEEN [phone]([id number]) on  02/28/2014 9:17:59 AM       EKG #2  ED ECG REPORT   Date: 02/28/2014  EKG Time: 10:36 AM  Rate: 160  Rhythm: sinus tachycardia,     Narrative Interpretation: sinus tachycardia, with extensive artifact   EKG reviewed by Dr. Clarene DukeMcManus    second EKG ordered due to sudden, brief tachycardia that resolved within minutes.  Patient tearful and anxious at the time of the second EKG.  Tachycardia improving as I entered the room, patient's boyfriend also in the room.          MDM   Final diagnoses:  Anxiety    Pt is well appearing, non-toxic.  ambulates with a steady gait.  Denies SI or HI.  Episodic tachycardia, occurs when patient becomes upset or tearful.  Improved after medications. No concerning sx's for PE.  She reports feeling better and reports ready for d/c.  I have advised her to contact [facility] and arrange  for f/u.  Pt agrees to plan and appears stable for d/c.  She also agrees to return if needed.      Jaella Weinert L. Trisha Mangle, PA-C 03/02/14 2132  Samuel Jester, DO 03/03/14 1007

## 2014-02-28 NOTE — ED Notes (Signed)
Pt reports has been under a lot of stress recently and was started on celexa and trazodone Monday.  Pt says works 3rd shift last night and when she tried to go to sleep this morning, felt very anxious, dizzy, heart racing.  Reports left arm and back have been hurting for a few days but says is worse with movement.  States " I think I slept wrong."

## 2014-05-07 ENCOUNTER — Encounter (HOSPITAL_BASED_OUTPATIENT_CLINIC_OR_DEPARTMENT_OTHER): Payer: Self-pay | Admitting: Emergency Medicine

## 2014-05-07 ENCOUNTER — Emergency Department (HOSPITAL_COMMUNITY)
Admission: EM | Admit: 2014-05-07 | Discharge: 2014-05-07 | Disposition: A | Discharge status: Home / Self Care | Payer: Self-pay | Attending: Emergency Medicine | Admitting: Emergency Medicine

## 2014-05-07 ENCOUNTER — Encounter (HOSPITAL_COMMUNITY): Payer: Self-pay | Admitting: Emergency Medicine

## 2014-05-07 ENCOUNTER — Emergency Department (HOSPITAL_BASED_OUTPATIENT_CLINIC_OR_DEPARTMENT_OTHER)
Admission: EM | Admit: 2014-05-07 | Discharge: 2014-05-08 | Disposition: A | Discharge status: Home / Self Care | Payer: Self-pay | Attending: Emergency Medicine | Admitting: Emergency Medicine

## 2014-05-07 DIAGNOSIS — I4589 Other specified conduction disorders: Secondary | ICD-10-CM | POA: Insufficient documentation

## 2014-05-07 DIAGNOSIS — Z79899 Other long term (current) drug therapy: Secondary | ICD-10-CM | POA: Insufficient documentation

## 2014-05-07 DIAGNOSIS — G8929 Other chronic pain: Secondary | ICD-10-CM | POA: Insufficient documentation

## 2014-05-07 DIAGNOSIS — F419 Anxiety disorder, unspecified: Secondary | ICD-10-CM | POA: Insufficient documentation

## 2014-05-07 DIAGNOSIS — F1310 Sedative, hypnotic or anxiolytic abuse, uncomplicated: Secondary | ICD-10-CM | POA: Insufficient documentation

## 2014-05-07 DIAGNOSIS — E039 Hypothyroidism, unspecified: Secondary | ICD-10-CM | POA: Insufficient documentation

## 2014-05-07 DIAGNOSIS — Z791 Long term (current) use of non-steroidal anti-inflammatories (NSAID): Secondary | ICD-10-CM | POA: Insufficient documentation

## 2014-05-07 DIAGNOSIS — Z87448 Personal history of other diseases of urinary system: Secondary | ICD-10-CM | POA: Insufficient documentation

## 2014-05-07 DIAGNOSIS — Z872 Personal history of diseases of the skin and subcutaneous tissue: Secondary | ICD-10-CM | POA: Insufficient documentation

## 2014-05-07 DIAGNOSIS — F1610 Hallucinogen abuse, uncomplicated: Secondary | ICD-10-CM

## 2014-05-07 DIAGNOSIS — Z720 Tobacco use: Secondary | ICD-10-CM | POA: Insufficient documentation

## 2014-05-07 DIAGNOSIS — R000 Tachycardia, unspecified: Secondary | ICD-10-CM | POA: Insufficient documentation

## 2014-05-07 DIAGNOSIS — Z3202 Encounter for pregnancy test, result negative: Secondary | ICD-10-CM | POA: Insufficient documentation

## 2014-05-07 DIAGNOSIS — R9431 Abnormal electrocardiogram [ECG] [EKG]: Secondary | ICD-10-CM

## 2014-05-07 DIAGNOSIS — E876 Hypokalemia: Secondary | ICD-10-CM | POA: Insufficient documentation

## 2014-05-07 HISTORY — DX: Panic disorder (episodic paroxysmal anxiety): F41.0

## 2014-05-07 LAB — URINALYSIS, ROUTINE W REFLEX MICROSCOPIC
BILIRUBIN URINE: NEGATIVE
Glucose, UA: NEGATIVE mg/dL
Hgb urine dipstick: NEGATIVE
KETONES UR: NEGATIVE mg/dL
LEUKOCYTES UA: NEGATIVE
Nitrite: NEGATIVE
Protein, ur: NEGATIVE mg/dL
Specific Gravity, Urine: 1.004 — ABNORMAL LOW (ref 1.005–1.030)
Urobilinogen, UA: 0.2 mg/dL (ref 0.0–1.0)
pH: 6.5 (ref 5.0–8.0)

## 2014-05-07 LAB — COMPREHENSIVE METABOLIC PANEL
ALBUMIN: 4 g/dL (ref 3.5–5.2)
ALK PHOS: 99 U/L (ref 39–117)
ALT: 18 U/L (ref 0–35)
AST: 22 U/L (ref 0–37)
Anion gap: 15 (ref 5–15)
BUN: 7 mg/dL (ref 6–23)
CO2: 21 mmol/L (ref 19–32)
Calcium: 9.1 mg/dL (ref 8.4–10.5)
Chloride: 101 mmol/L (ref 96–112)
Creatinine, Ser: 0.85 mg/dL (ref 0.50–1.10)
GFR calc Af Amer: >90 mL/min (ref >90)
GFR calc non Af Amer: >90 mL/min (ref >90)
Glucose, Bld: 165 mg/dL — ABNORMAL HIGH (ref 70–99)
Potassium: 2.8 mmol/L — ABNORMAL LOW (ref 3.5–5.1)
Sodium: 137 mmol/L (ref 135–145)
Total Bilirubin: 0.7 mg/dL (ref 0.3–1.2)
Total Protein: 7.6 g/dL (ref 6.0–8.3)

## 2014-05-07 LAB — RAPID URINE DRUG SCREEN, HOSP PERFORMED
Amphetamines: NOT DETECTED
BARBITURATES: NOT DETECTED
BENZODIAZEPINES: POSITIVE — AB
Cocaine: NOT DETECTED
OPIATES: NOT DETECTED
TETRAHYDROCANNABINOL: NOT DETECTED

## 2014-05-07 LAB — CBC WITH DIFFERENTIAL/PLATELET
BASOS PCT: 0 % (ref 0–1)
Basophils Absolute: 0 10*3/uL (ref 0.0–0.1)
Eosinophils Absolute: 0.1 10*3/uL (ref 0.0–0.7)
Eosinophils Relative: 1 % (ref 0–5)
HCT: 40.9 % (ref 36.0–46.0)
Hemoglobin: 13.9 g/dL (ref 12.0–15.0)
LYMPHS PCT: 28 % (ref 12–46)
Lymphs Abs: 3.5 10*3/uL (ref 0.7–4.0)
MCH: 29.3 pg (ref 26.0–34.0)
MCHC: 34 g/dL (ref 30.0–36.0)
MCV: 86.3 fL (ref 78.0–100.0)
Monocytes Absolute: 0.9 10*3/uL (ref 0.1–1.0)
Monocytes Relative: 7 % (ref 3–12)
NEUTROS ABS: 8.1 10*3/uL — AB (ref 1.7–7.7)
Neutrophils Relative %: 64 % (ref 43–77)
Platelets: 313 10*3/uL (ref 150–400)
RBC: 4.74 MIL/uL (ref 3.87–5.11)
RDW: 13.2 % (ref 11.5–15.5)
WBC: 12.6 10*3/uL — AB (ref 4.0–10.5)

## 2014-05-07 LAB — ETHANOL: Alcohol, Ethyl (B): <5 mg/dL (ref 0–9)

## 2014-05-07 LAB — POC URINE PREG, ED: Preg Test, Ur: NEGATIVE

## 2014-05-07 MED ORDER — POTASSIUM CHLORIDE ER 20 MEQ PO TBCR: 10.0000 meq | EXTENDED_RELEASE_TABLET | Freq: Every day | ORAL | Status: DC

## 2014-05-07 MED ORDER — SODIUM CHLORIDE 0.9 % IV BOLUS (SEPSIS)
1000.0000 mL | Freq: Once | INTRAVENOUS | Status: AC
Start: 2014-05-07 — End: 2014-05-07
  Administered 2014-05-07: 1000 mL via INTRAVENOUS

## 2014-05-07 MED ORDER — ONDANSETRON HCL 4 MG/2ML IJ SOLN
4.0000 mg | Freq: Once | INTRAMUSCULAR | Status: AC
  Administered 2014-05-07: 4 mg via INTRAVENOUS
  Filled 2014-05-07: qty 2

## 2014-05-07 MED ORDER — LORAZEPAM 2 MG/ML IJ SOLN
1.0000 mg | Freq: Once | INTRAMUSCULAR | Status: AC
  Administered 2014-05-07: 1 mg via INTRAVENOUS
  Filled 2014-05-07: qty 1

## 2014-05-07 MED ORDER — POTASSIUM CHLORIDE CRYS ER 20 MEQ PO TBCR
40.0000 meq | EXTENDED_RELEASE_TABLET | Freq: Once | ORAL | Status: AC
  Administered 2014-05-07: 40 meq via ORAL
  Filled 2014-05-07: qty 2

## 2014-05-07 MED ORDER — POTASSIUM CHLORIDE 10 MEQ/100ML IV SOLN
10.0000 meq | INTRAVENOUS | Status: AC
  Administered 2014-05-07 (×2): 10 meq via INTRAVENOUS
  Filled 2014-05-07 (×2): qty 100

## 2014-05-07 NOTE — ED Notes (Signed)
Patient in via Ems  - states that she is anxious after taking "molly" last night. Was seen at ED earlier today for same, patient states that she is still feeling anxious with dry mouth, hands have rash - no noted rash at this time.

## 2014-05-07 NOTE — ED Notes (Signed)
Pt has been drinking fluids with no difficulty

## 2014-05-07 NOTE — ED Provider Notes (Signed)
TIME SEEN: 9:12 AM  CHIEF COMPLAINT: Anxiety  HPI: Pt is a 29 y.o. female with history of PCOS, hypothyroidism who presents to the emergency department with complaints of anxiety, headache, dry mouth, palpitations, feeling like she is going to pass out after taking MDMA at 3 AM. Denies any other ingestions. States that she normally does not abuse drugs. States she has noticed bruising on her arms and inner thighs but denies any trauma.  ROS: See HPI Constitutional: no fever  Eyes: no drainage  ENT: no runny nose   Cardiovascular:  no chest pain  Resp: no SOB  GI: no vomiting GU: no dysuria Integumentary: no rash  Allergy: no hives  Musculoskeletal: no leg swelling  Neurological: no slurred speech ROS otherwise negative  PAST MEDICAL HISTORY/PAST SURGICAL HISTORY:  Past Medical History  Diagnosis Date  . Hydradenitis   . Polycystic ovarian disease   . Depression   . Chronic back pain   . Hypothyroid     MEDICATIONS:  Prior to Admission medications   Medication Sig Start Date End Date Taking? Authorizing Provider  citalopram (CELEXA) 20 MG tablet Take 20 mg by mouth daily.    Historical Provider, MD  diazepam (VALIUM) 5 MG tablet Take 1 tablet (5 mg total) by mouth 2 (two) times daily. 02/28/14   Tammy L. Triplett, PA-C  hydrOXYzine (ATARAX/VISTARIL) 25 MG tablet Take 1 tablet (25 mg total) by mouth every 6 (six) hours. Patient not taking: Reported on 02/28/2014 02/19/14   Vida RollerBrian D Miller, MD  ibuprofen (ADVIL,MOTRIN) 200 MG tablet Take 800 mg by mouth every 6 (six) hours as needed. For pain    Historical Provider, MD  levothyroxine (SYNTHROID, LEVOTHROID) 75 MCG tablet Take 75 mcg by mouth daily before breakfast.    Historical Provider, MD  meclizine (ANTIVERT) 12.5 MG tablet Take 25 mg by mouth 3 (three) times daily as needed.    Historical Provider, MD  meloxicam (MOBIC) 15 MG tablet Take 15 mg by mouth daily as needed for pain.     Historical Provider, MD  traZODone  (DESYREL) 100 MG tablet Take 50-100 mg by mouth at bedtime as needed for sleep.    Historical Provider, MD    ALLERGIES:  Allergies  Allergen Reactions  . Tramadol Itching    SOCIAL HISTORY:  History  Substance Use Topics  . Smoking status: Current Some Day Smoker -- 0.25 packs/day for 9 years    Types: Cigarettes    Last Attempt to Quit: 03/08/2012  . Smokeless tobacco: Never Used  . Alcohol Use: Yes     Comment: rarely    FAMILY HISTORY: No family history on file.  EXAM: BP 141/77 mmHg  Pulse 164  Temp(Src) 99.1 F (37.3 C) (Oral)  Resp 28  Ht 5\' 3"  (1.6 m)  Wt 325 lb (147.419 kg)  BMI 57.59 kg/m2  SpO2 97%  LMP 04/30/2014 CONSTITUTIONAL: Alert and oriented and responds appropriately to questions. Obese, appears very anxious, yelling out, crying HEAD: Normocephalic EYES: Conjunctivae clear, PERRL ENT: normal nose; no rhinorrhea; moist mucous membranes; pharynx without lesions noted NECK: Supple, no meningismus, no LAD  CARD: Regular and tachycardic; S1 and S2 appreciated; no murmurs, no clicks, no rubs, no gallops RESP: Normal chest excursion without splinting, patient is tachypneic and hyperventilating, breath sounds clear and equal bilaterally; no wheezes, no rhonchi, no rales, no hypoxia ABD/GI: Normal bowel sounds; non-distended; soft, non-tender, no rebound, no guarding BACK:  The back appears normal and is non-tender to palpation, there  is no CVA tenderness EXT: Normal ROM in all joints; non-tender to palpation; no edema; normal capillary refill; no cyanosis    SKIN: Normal color for age and race; warm NEURO: Moves all extremities equally, sensation to light touch intact diffusely, cranial nerves II through XII intact, no clonus PSYCH: The patient's mood and manner are appropriate. Grooming and personal hygiene are appropriate.  MEDICAL DECISION MAKING: Patient here with reaction after taking MDMA. EKG shows sinus tachycardia with slight ST depression likely  from elevated rate. She is also tachypneic and hyperventilating. No hypoxia. No chest pain or shortness of breath. We'll give IV fluids, Ativan. We'll check basic labs, urine. She will need to be closely monitored. It does appear patient takes trazodone and Celexa.  ED PROGRESS: Labs show leukocytosis which is likely reactive. Potassium is 2.8. Will replace. Tachycardia has improved but she is still having episodes of waking up being very anxious, agitated. Will give another dose of Ativan and continue to monitor.  1:50 PM  Pt's heart rate is slowly improving. Blood pressure stable. Patient resting comfortably.  2:50 PM  Pt reports feeling better. She is tolerating by mouth. Heart rate is in the low 100s. No chest pain or shortness of breath. She has no pupillary dilatation on exam, no diaphoresis, no clonus, normal reflexes. I do not think that she has serotonin syndrome. I feel she is safe to be discharged home. Have advised her to avoid taking illicit drugs in the future. Discussed return precautions. She verbalized understanding and is comfortable with plan. We'll discharge with prescription for potassium given her hypokalemia.   EKG Interpretation  Date/Time:  Tuesday May 07 2014 09:08:33 EST Ventricular Rate:  169 PR Interval:  101 QRS Duration: 86 QT Interval:  334 QTC Calculation: 560 R Axis:   21 Text Interpretation:  Sinus tachycardia Ventricular premature complex Borderline ST depression, lateral leads Prolonged QT interval Baseline wander in lead(s) III aVF V3 V6 No significant change since last tracing Confirmed by WARD,  DO, KRISTEN [phone]) on 05/07/2014 9:28:35 AM          Layla Maw Ward, DO 05/07/14 1448

## 2014-05-07 NOTE — ED Notes (Addendum)
Pt very anxious and c/o headache.  Pt state that she took MDA around 3 am. Pt states that she normally doesn't do stuff like this.  "pt crying saying that she doesn't feel right and I am going to die!"  Pt also takes synthroid, celexa.  Pt states that noticed bruising on her arms and legs and has reserached and believes she has Serotonin toxicity.

## 2014-05-07 NOTE — Discharge Instructions (Signed)
Ecstasy Abuse Ecstasy has been around for more than 80 years but recently achieved popularity as a "recreational drug". Ecstasy is taken by oral ingestion. Although teen use of most drugs has declined in the past 10 years, the use of ecstasy or "e" has gone up. The use of ecstasy affects the brain by increasing the release of serotonin in the brain. This is the "feel good" neurotransmitter between nerves. Studies done on people who have used ecstasy show poorer performance on memory tests, and computer images show that there are fewer serotonin receptors in ecstasy users, which may have irreversible consequences. Much of the ecstasy on the market is impure. A number of people taking this medication die of adulterants (contaminants) purposely added which raise the body temperature to a fatal level (hyperthermia). The use of ecstasy is also followed by a "crash". The crash is associated with depressed feelings, which cause a craving for the drug to regain the high or feeling of normalcy. WHEN IS DRUG USE A PROBLEM? Anytime drug use is interfering with normal living activities it has become abuse. This includes problems with family and friends. Psychological dependence has developed when your mind tells you that the drug is needed. Street drugs are filled with impurities which can cause serious problems or even death. If you have signs of chemical dependency or addiction, you should seek help immediately. There are many resources available to help combat alcoholism and chemical dependency. SOME OF THE SIGNS OF CHEMICAL DEPENDENCY ARE LISTED BELOW:  You have been told by friends or family that drugs have become a problem.  Changes in mood or behavior when using drugs.  Having blackouts (not remembering what you do while using).  Lying about use or amounts of drugs (chemicals) used.  You feel that you need the drug to "get going" or function normally.  You suffer in work performance or school because of  drug use.  Using drugs makes you ill but you continue to use anyway.  You feel you need drugs to relate to people or feel comfortable in social situations.  You use drugs to escape from problems. If you answered "yes" to any of the above signs of chemical dependency it indicates you have a problem. The longer the use of drugs continues, the greater the problems will become. If there is a family history of drug or alcohol abuse it is even more important not to experiment with drugs. Drug dependence definitely follows patterns of inheritance. HOW TO STAY DRUG FREE ONCE YOU HAVE QUIT USING  Develop healthy activities and form friends who do not use drugs.  Stay away from the drug scene.  Continue to utilize any and all outside resources to help you avoid continued or future drug [place] and dependency.  Develop a strong support system of friends or family who will help you avoid future drug use. Document Released: 02/20/2000 Document Revised: 05/17/2011 Document Reviewed: 05/11/2013 [facility] Patient Information 2015 [place], [place]. This information is not intended to replace advice given to you by your health care provider. Make sure you discuss any questions you have with your health care provider.  Hypokalemia Hypokalemia means that the amount of potassium in the blood is lower than normal.Potassium is a chemical, called an electrolyte, that helps regulate the amount of fluid in the body. It also stimulates muscle contraction and helps nerves function properly.Most of the body's potassium is inside of cells, and only a very small amount is in the blood. Because the amount in the  blood is so small, minor changes can be life-threatening. CAUSES  Antibiotics.  Diarrhea or vomiting.  Using laxatives too much, which can cause diarrhea.  Chronic kidney disease.  Water pills (diuretics).  Eating disorders (bulimia).  Low magnesium level.  Sweating a lot. SIGNS AND  SYMPTOMS  Weakness.  Constipation.  Fatigue.  Muscle cramps.  Mental confusion.  Skipped heartbeats or irregular heartbeat (palpitations).  Tingling or numbness. DIAGNOSIS  Your health care provider can diagnose hypokalemia with blood tests. In addition to checking your potassium level, your health care provider may also check other lab tests. TREATMENT Hypokalemia can be treated with potassium supplements taken by mouth or adjustments in your current medicines. If your potassium level is very low, you may need to get potassium through a vein (IV) and be monitored in the hospital. A diet high in potassium is also helpful. Foods high in potassium are:  Nuts, such as peanuts and pistachios.  Seeds, such as sunflower seeds and pumpkin seeds.  Peas, lentils, and lima beans.  Whole grain and bran cereals and breads.  Fresh fruit and vegetables, such as apricots, avocado, bananas, cantaloupe, kiwi, oranges, tomatoes, asparagus, and potatoes.  Orange and tomato juices.  Red meats.  Fruit yogurt. HOME CARE INSTRUCTIONS  Take all medicines as prescribed by your health care provider.  Maintain a healthy diet by including nutritious food, such as fruits, vegetables, nuts, whole grains, and lean meats.  If you are taking a laxative, be sure to follow the directions on the label. SEEK MEDICAL CARE IF:  Your weakness gets worse.  You feel your heart pounding or racing.  You are vomiting or having diarrhea.  You are diabetic and having trouble keeping your blood glucose in the normal range. SEEK IMMEDIATE MEDICAL CARE IF:  You have chest pain, shortness of breath, or dizziness.  You are vomiting or having diarrhea for more than 2 days.  You faint. MAKE SURE YOU:   Understand these instructions.  Will watch your condition.  Will get help right away if you are not doing well or get worse. Document Released: 02/22/2005 Document Revised: 12/13/2012 Document Reviewed:  08/25/2012 [facility] Patient Information 2015 [place]. This information is not intended to replace advice given to you by your health care provider. Make sure you discuss any questions you have with your health care provider.

## 2014-05-07 NOTE — ED Notes (Signed)
MD at bedside. 

## 2014-05-08 ENCOUNTER — Encounter (HOSPITAL_BASED_OUTPATIENT_CLINIC_OR_DEPARTMENT_OTHER): Payer: Self-pay | Admitting: Emergency Medicine

## 2014-05-08 LAB — TSH: TSH: 3.078 u[IU]/mL (ref 0.350–4.500)

## 2014-05-08 LAB — TROPONIN I

## 2014-05-08 MED ORDER — LORAZEPAM 2 MG/ML IJ SOLN
1.0000 mg | Freq: Once | INTRAMUSCULAR | Status: AC
  Administered 2014-05-08: 1 mg via INTRAVENOUS
  Filled 2014-05-08: qty 1

## 2014-05-08 MED ORDER — LORAZEPAM 1 MG PO TABS: 1.0000 mg | ORAL_TABLET | Freq: Three times a day (TID) | ORAL | Status: DC | PRN

## 2014-05-08 MED ORDER — PROPRANOLOL HCL 1 MG/ML IV SOLN
1.0000 mg | Freq: Once | INTRAVENOUS | Status: AC
  Administered 2014-05-08: 1 mg via INTRAVENOUS
  Filled 2014-05-08: qty 1

## 2014-05-08 NOTE — ED Notes (Signed)
MD at bedside. 

## 2014-05-08 NOTE — ED Notes (Signed)
Patient calmed in room. Patient is panicking in the room. Patient assured that she is ok and that all her symptoms were related to the panic feeling.

## 2014-05-08 NOTE — ED Provider Notes (Signed)
CSN: [id number]     Arrival date & time 05/07/14  2351 History   First MD Initiated Contact with Patient 05/08/14 0105     Chief Complaint  Patient presents with  . Anxiety     (Consider location/radiation/quality/duration/timing/severity/associated sxs/prior Treatment) HPI  This is a 29 year old female with a history of panic attacks who was seen at [facility] ED yesterday morning at 9:12 AM after taking methylene deoxy methamphetamine yesterday morning about 3 AM. She was experiencing anxiety, hyperventilation, dry mouth and tachycardia. She was treated with IV Ativan and discharged home.  The patient was asked to provide more contacts. She relates that she has been experiencing increased stress and anxiety for about 4 months. She was placed on Celexa with some improvement. She admits to trying MDM a yesterday morning but states that she does not normally use drugs and that was the only dose she had tried. She states she only took a small dose. What has changed, and has made her come to the ED again, is that her heart has been racing all day and she has felt pain in her chest for the past several days. She has had tightness in her chest previously but the pain she is experiencing is something new. The pain is intermittent and when it occurs it radiates to her legs. She complains of having a headache ("a weird feeling, like my head hurts, but like there's a net or something hanging down over my face"), the sensation that her tongue is swollen, the sensation that her throat is swollen and paresthesias in her extremities. She also believes she was exposed to something in a cheap hotel room the night before last and has some erythema of her left hypothenar eminence.  She is on Synthroid for hypothyroidism but has not had her levels checked in almost a year.  Past Medical History  Diagnosis Date  . Hydradenitis   . Polycystic ovarian disease   . Depression   . Chronic back pain   . Hypothyroid    . Panic attack    Past Surgical History  Procedure Laterality Date  . Cholecystectomy    . Dilation and curettage of uterus     History reviewed. No pertinent family history. History  Substance Use Topics  . Smoking status: Current Some Day Smoker -- 0.25 packs/day for 9 years    Types: Cigarettes    Last Attempt to Quit: 03/08/2012  . Smokeless tobacco: Never Used  . Alcohol Use: Yes     Comment: rarely   OB History    Gravida Para Term Preterm AB TAB SAB Ectopic Multiple Living   3    3  3         Review of Systems  All other systems reviewed and are negative.   Allergies  Tramadol  Home Medications   Prior to Admission medications   Medication Sig Start Date End Date Taking? Authorizing Provider  acetaminophen (TYLENOL) 500 MG tablet Take 1,000 mg by mouth every 6 (six) hours as needed for mild pain or headache.    Historical Provider, MD  citalopram (CELEXA) 20 MG tablet Take 20 mg by mouth daily.    Historical Provider, MD  diazepam (VALIUM) 5 MG tablet Take 1 tablet (5 mg total) by mouth 2 (two) times daily. 02/28/14   Tammy L. Triplett, PA-C  hydrOXYzine (ATARAX/VISTARIL) 25 MG tablet Take 1 tablet (25 mg total) by mouth every 6 (six) hours. Patient not taking: Reported on 02/28/2014 02/19/14  Vida Roller, MD  ibuprofen (ADVIL,MOTRIN) 200 MG tablet Take 800 mg by mouth every 6 (six) hours as needed. For pain    Historical Provider, MD  levothyroxine (SYNTHROID, LEVOTHROID) 75 MCG tablet Take 75 mcg by mouth daily before breakfast.    Historical Provider, MD  meclizine (ANTIVERT) 12.5 MG tablet Take 25 mg by mouth 3 (three) times daily as needed.    Historical Provider, MD  meloxicam (MOBIC) 15 MG tablet Take 15 mg by mouth daily as needed for pain.     Historical Provider, MD  potassium chloride 20 MEQ TBCR Take 10 mEq by mouth daily. 05/07/14   Kristen N Ward, DO   BP 133/74 mmHg  Pulse 115  Temp(Src) 98.9 F (37.2 C) (Oral)  Resp 24  Ht  (1.6 m)  Wt  320 lb (145.151 kg)  BMI 56.70 kg/m2  SpO2 100%  LMP 04/30/2014   Physical Exam  General: Well-developed, obese female in no acute distress; appearance consistent with age of record HENT: normocephalic; atraumatic; no appreciable edema of tongue or throat; no dysphonia Eyes: pupils equal, round and reactive to light; extraocular muscles intact Neck: supple Heart: regular rate and rhythm; tachycardia Lungs: clear to auscultation bilaterally Abdomen: soft; nondistended; nontender; bowel sounds present Extremities: No deformity; full range of motion; pulses normal Neurologic: Awake, alert and oriented; motor function intact in all extremities and symmetric; no facial droop Skin: Warm and dry; mild erythema of left hypo-thenar eminence; facial acne Psychiatric: Anxious; circumstantial speech   ED Course  Procedures (including critical care time)   MDM   Nursing notes and vitals signs, including pulse oximetry, reviewed.  Summary of this visit's results, reviewed by myself:  Labs:  Results for orders placed or performed during the hospital encounter of 05/07/14 (from the past 24 hour(s))  Troponin I     Status: None   Collection Time: 05/08/14  1:39 AM  Result Value Ref Range   Troponin I <0.03 <0.031 ng/mL   3:17 AM Patient symptoms have improved significantly after 1 milligram of IV propranolol 1 milligram of IV Ativan. Her EKG is showing QT prolongation and she has been advised to discontinue her Celexa. She was advised to contact both her psychiatrist and her medical doctor today for follow-up. She has a TSH pending but she was advised that we will not get the results in the ED but her tachycardia is concerning for hyperthyroidism.      Hanley Seamen, MD 05/08/14 [phone]

## 2014-05-08 NOTE — Discharge Instructions (Signed)
Please contact your primary care physician as well as your psychiatrist today. Your EKG shows something called QT Prolongation. Some drugs, such as Celexa, cause or worsen this condition. Please stop taking Celexa until you can discuss this with your physicians. Also, a TSH (Thyroid Stimulating Hormone) level was drawn in the ED, but we will not get the result during this visit. Your primary care physician can follow up on the results to make sure your Synthroid dose is correct.

## 2014-05-09 ENCOUNTER — Emergency Department (HOSPITAL_COMMUNITY)
Admission: EM | Admit: 2014-05-09 | Discharge: 2014-05-09 | Disposition: A | Discharge status: Home / Self Care | Payer: Self-pay | Attending: Emergency Medicine | Admitting: Emergency Medicine

## 2014-05-09 ENCOUNTER — Encounter (HOSPITAL_COMMUNITY): Payer: Self-pay | Admitting: *Deleted

## 2014-05-09 DIAGNOSIS — F410 Panic disorder [episodic paroxysmal anxiety] without agoraphobia: Secondary | ICD-10-CM

## 2014-05-09 DIAGNOSIS — R000 Tachycardia, unspecified: Secondary | ICD-10-CM | POA: Insufficient documentation

## 2014-05-09 DIAGNOSIS — G8929 Other chronic pain: Secondary | ICD-10-CM | POA: Insufficient documentation

## 2014-05-09 DIAGNOSIS — R110 Nausea: Secondary | ICD-10-CM | POA: Insufficient documentation

## 2014-05-09 DIAGNOSIS — R51 Headache: Secondary | ICD-10-CM | POA: Insufficient documentation

## 2014-05-09 DIAGNOSIS — Z872 Personal history of diseases of the skin and subcutaneous tissue: Secondary | ICD-10-CM | POA: Insufficient documentation

## 2014-05-09 DIAGNOSIS — Z720 Tobacco use: Secondary | ICD-10-CM | POA: Insufficient documentation

## 2014-05-09 DIAGNOSIS — R0602 Shortness of breath: Secondary | ICD-10-CM | POA: Insufficient documentation

## 2014-05-09 DIAGNOSIS — F329 Major depressive disorder, single episode, unspecified: Secondary | ICD-10-CM | POA: Insufficient documentation

## 2014-05-09 DIAGNOSIS — Z79899 Other long term (current) drug therapy: Secondary | ICD-10-CM | POA: Insufficient documentation

## 2014-05-09 DIAGNOSIS — E039 Hypothyroidism, unspecified: Secondary | ICD-10-CM | POA: Insufficient documentation

## 2014-05-09 MED ORDER — ALPRAZOLAM 0.25 MG PO TABS: 0.2500 mg | ORAL_TABLET | Freq: Three times a day (TID) | ORAL | Status: DC | PRN

## 2014-05-09 MED ORDER — ALPRAZOLAM 0.25 MG PO TABS
0.2500 mg | ORAL_TABLET | Freq: Once | ORAL | Status: AC
  Administered 2014-05-09: 0.25 mg via ORAL
  Filled 2014-05-09: qty 1

## 2014-05-09 NOTE — Discharge Instructions (Signed)

## 2014-05-09 NOTE — ED Notes (Signed)
While pt was sitting still for EKG, HR decreased to 111. Pt then started to cry, HR increased to 135.

## 2014-05-09 NOTE — ED Notes (Addendum)
Pt c/o palpitations tonight. Pt reports facial and hand numbness for the past two day.  Pt was [facility] at [facility] yesterday for similar symptoms.  Pt appears to be very anxious

## 2014-05-09 NOTE — ED Notes (Signed)
Pt a/o x 4 on d/c with steady gait. 

## 2014-05-09 NOTE — ED Notes (Signed)
Dr. yelverton at the bedside.  

## 2014-05-09 NOTE — ED Provider Notes (Signed)
CSN: [id number]     Arrival date & time 05/09/14  0241 History  This chart was scribed for Loren Raceravid Retaj Hilbun, MD by Annye AsaAnna Dorsett, ED Scribe. This patient was seen in room A09C/A09C and the patient's care was started at 3:30 AM.   Chief Complaint  Patient presents with  . Tachycardia  . Anxiety   Patient is a 29 y.o. female presenting with anxiety. The history is provided by the patient. No language interpreter was used.  Anxiety Associated symptoms include headaches and shortness of breath.   HPI Comments: Laurie Macias is a 29 y.o. female with past medical history of panic attacks who presents to the Emergency Department complaining of anxiety, tachycardia. Patient feels as though she is having a "heart issue;" she can "sense" the heart issue coming on with temperature change in her extremities (hot then cold) and then numbness to the hands and feet. She also notes headache, described as "something draped across her face," making her feel hot. She reports nausea. She denies vomiting.    She was recently placed on Celexa for her depression and anxiety and states that this has not improved her symptoms. She is under significant stress at home; "financial situations, living situations, my health." She feels like "no one is listening to her." Patient has been several seen several times in the emergency department for similar complaints with normal workup including normal TSH level.  Past Medical History  Diagnosis Date  . Hydradenitis   . Polycystic ovarian disease   . Depression   . Chronic back pain   . Hypothyroid   . Panic attack    Past Surgical History  Procedure Laterality Date  . Cholecystectomy    . Dilation and curettage of uterus     History reviewed. No pertinent family history. History  Substance Use Topics  . Smoking status: Current Some Day Smoker -- 0.25 packs/day for 9 years    Types: Cigarettes    Last Attempt to Quit: 03/08/2012  . Smokeless tobacco: Never Used  .  Alcohol Use: Yes     Comment: rarely   OB History    Gravida Para Term Preterm AB TAB SAB Ectopic Multiple Living   3    3  3         Review of Systems  Constitutional: Negative for fever and chills.  Respiratory: Positive for shortness of breath. Negative for cough.   Gastrointestinal: Positive for nausea. Negative for vomiting.  Musculoskeletal: Negative for back pain, neck pain and neck stiffness.  Skin: Negative for rash and wound.  Neurological: Positive for headaches. Negative for dizziness, weakness, light-headedness and numbness.  Psychiatric/Behavioral: The patient is nervous/anxious.   All other systems reviewed and are negative.     Allergies  Tramadol  Home Medications   Prior to Admission medications   Medication Sig Start Date End Date Taking? Authorizing Provider  acetaminophen (TYLENOL) 500 MG tablet Take 1,000 mg by mouth every 6 (six) hours as needed for mild pain or headache.   Yes Historical Provider, MD  ibuprofen (ADVIL,MOTRIN) 200 MG tablet Take 800 mg by mouth every 6 (six) hours as needed for moderate pain. For pain   Yes Historical Provider, MD  levothyroxine (SYNTHROID, LEVOTHROID) 75 MCG tablet Take 75 mcg by mouth daily before breakfast.   Yes Historical Provider, MD  LORazepam (ATIVAN) 1 MG tablet Take 1 tablet (1 mg total) by mouth 3 (three) times daily as needed for anxiety. 05/08/14  Yes Hanley SeamenJohn L Molpus, MD  meclizine (ANTIVERT) 12.5 MG tablet Take 25 mg by mouth 3 (three) times daily as needed for dizziness.    Yes Historical Provider, MD  meloxicam (MOBIC) 15 MG tablet Take 15 mg by mouth daily as needed for pain.    Yes Historical Provider, MD  Multiple Vitamin (MULTIVITAMIN WITH MINERALS) TABS tablet Take 1 tablet by mouth daily.   Yes Historical Provider, MD  potassium chloride 20 MEQ TBCR Take 10 mEq by mouth daily. 05/07/14  Yes Kristen N Ward, DO  ALPRAZolam (XANAX) 0.25 MG tablet Take 1 tablet (0.25 mg total) by mouth 3 (three) times daily as  needed for anxiety. 05/09/14   Loren Racer, MD  diazepam (VALIUM) 5 MG tablet Take 1 tablet (5 mg total) by mouth 2 (two) times daily. Patient not taking: Reported on 05/08/2014 02/28/14   Tammy L. Triplett, PA-C   BP 134/93 mmHg  Pulse 104  Temp(Src) 98.5 F (36.9 C) (Oral)  Resp 13  SpO2 100%  LMP 04/30/2014 Physical Exam  Constitutional: She is oriented to person, place, and time. She appears well-developed and well-nourished. No distress.  Anxious but easily distracted.  HENT:  Head: Normocephalic and atraumatic.  Mouth/Throat: Oropharynx is clear and moist. No oropharyngeal exudate.  Eyes: EOM are normal. Pupils are equal, round, and reactive to light.  Neck: Normal range of motion. Neck supple.  No meningismus. No thyromegaly  Cardiovascular: Regular rhythm.   Tachycardia  Pulmonary/Chest: Effort normal and breath sounds normal. No respiratory distress. She has no wheezes. She has no rales. She exhibits no tenderness.  Abdominal: Soft. Bowel sounds are normal. She exhibits no distension and no mass. There is no tenderness. There is no rebound and no guarding.  Musculoskeletal: Normal range of motion. She exhibits no edema or tenderness.  No calf swelling or tenderness.  Neurological: She is alert and oriented to person, place, and time.  5/5 motor in all extremities. Sensation intact.  Skin: Skin is warm and dry. No rash noted. No erythema.  Psychiatric:  Anxious  Nursing note and vitals reviewed.   ED Course  Procedures   DIAGNOSTIC STUDIES: Oxygen Saturation is 100% on RA, normal by my interpretation.    COORDINATION OF CARE: 3:45 AM Discussed treatment plan with pt at bedside and pt agreed to plan.  Labs Review Labs Reviewed - No data to display  Imaging Review No results found.   EKG Interpretation None      Date: 05/09/2014  Rate:93  Rhythm: normal sinus rhythm  QRS Axis: normal  Intervals: normal  ST/T Wave abnormalities: normal  Conduction  Disutrbances:none  Narrative Interpretation:   Old EKG Reviewed: unchanged   MDM   Final diagnoses:  Panic disorder    I personally performed the services described in this documentation, which was scribed in my presence. The recorded information has been reviewed and is accurate.   Extensive reassurance in the emergency department. EKG is essentially unchanged. Heart rate decreases to under 100 when comment distracted. I do not believe that further workup is necessary at this point. Patient is being given Xanax in the emergency department we have been given a short course of Xanax until she can follow up with her primary provider. Return precautions given.    Loren Racer, MD 05/09/14 [phone]

## 2014-05-20 ENCOUNTER — Encounter (HOSPITAL_COMMUNITY): Payer: Self-pay | Admitting: Emergency Medicine

## 2014-05-20 ENCOUNTER — Emergency Department (HOSPITAL_COMMUNITY)
Admission: EM | Admit: 2014-05-20 | Discharge: 2014-05-20 | Disposition: A | Discharge status: Home / Self Care | Payer: Self-pay | Attending: Emergency Medicine | Admitting: Emergency Medicine

## 2014-05-20 DIAGNOSIS — R000 Tachycardia, unspecified: Secondary | ICD-10-CM | POA: Insufficient documentation

## 2014-05-20 DIAGNOSIS — Z720 Tobacco use: Secondary | ICD-10-CM | POA: Insufficient documentation

## 2014-05-20 DIAGNOSIS — Z872 Personal history of diseases of the skin and subcutaneous tissue: Secondary | ICD-10-CM | POA: Insufficient documentation

## 2014-05-20 DIAGNOSIS — Z79899 Other long term (current) drug therapy: Secondary | ICD-10-CM | POA: Insufficient documentation

## 2014-05-20 DIAGNOSIS — F329 Major depressive disorder, single episode, unspecified: Secondary | ICD-10-CM | POA: Insufficient documentation

## 2014-05-20 DIAGNOSIS — E039 Hypothyroidism, unspecified: Secondary | ICD-10-CM | POA: Insufficient documentation

## 2014-05-20 DIAGNOSIS — E669 Obesity, unspecified: Secondary | ICD-10-CM | POA: Insufficient documentation

## 2014-05-20 DIAGNOSIS — Z791 Long term (current) use of non-steroidal anti-inflammatories (NSAID): Secondary | ICD-10-CM | POA: Insufficient documentation

## 2014-05-20 DIAGNOSIS — Z8739 Personal history of other diseases of the musculoskeletal system and connective tissue: Secondary | ICD-10-CM | POA: Insufficient documentation

## 2014-05-20 DIAGNOSIS — G8929 Other chronic pain: Secondary | ICD-10-CM | POA: Insufficient documentation

## 2014-05-20 DIAGNOSIS — F410 Panic disorder [episodic paroxysmal anxiety] without agoraphobia: Secondary | ICD-10-CM | POA: Insufficient documentation

## 2014-05-20 DIAGNOSIS — G43009 Migraine without aura, not intractable, without status migrainosus: Secondary | ICD-10-CM | POA: Insufficient documentation

## 2014-05-20 MED ORDER — KETOROLAC TROMETHAMINE 60 MG/2ML IM SOLN
60.0000 mg | Freq: Once | INTRAMUSCULAR | Status: AC
  Administered 2014-05-20: 60 mg via INTRAMUSCULAR
  Filled 2014-05-20: qty 2

## 2014-05-20 MED ORDER — METOCLOPRAMIDE HCL 5 MG/ML IJ SOLN
10.0000 mg | Freq: Once | INTRAMUSCULAR | Status: AC
  Administered 2014-05-20: 10 mg via INTRAMUSCULAR
  Filled 2014-05-20: qty 2

## 2014-05-20 MED ORDER — DIPHENHYDRAMINE HCL 50 MG/ML IJ SOLN
50.0000 mg | Freq: Once | INTRAMUSCULAR | Status: AC
  Administered 2014-05-20: 50 mg via INTRAMUSCULAR
  Filled 2014-05-20: qty 1

## 2014-05-20 NOTE — Discharge Instructions (Signed)
Go home and rest. Recheck as needed. Talk to your doctor about your worsening headaches.

## 2014-05-20 NOTE — ED Notes (Signed)
Pt c/o headache starting this evening. States she is concerned because it feels like the pain she experienced when her potassium was low a few weeks ago and she had to receive IV potassium.

## 2014-05-20 NOTE — ED Provider Notes (Signed)
CSN: [id number]     Arrival date & time 05/20/14  [id number] History   First MD Initiated Contact with Patient 05/20/14 0534     Chief Complaint  Patient presents with  . Headache     (Consider location/radiation/quality/duration/timing/severity/associated sxs/prior Treatment) HPI  Patient states about an hour and a half ago she was watching TV trying to fall asleep and she started getting a headache. She states she feels like it's on top of her head is a pressure throbbing feeling like she's wearing a hat it's too tight. She just describes photophobia and some noise sensitivity. She states she started feeling shaky and feeling like her heart might be racing. As a history of anxiety and she has been seen in the ED 3 times earlier this month after taking MDM when she was also noted to be tachycardic. She denies chest pain. She states she initially had some tingling in her extremities however when she stood up and walked it went away. She reports she has been having headaches off and on however they seem to be getting worse the past 4-5 months. She states her doctor is considering sending her to a neurologist.  PCP Dr Roseanne RenoHassan Psych [facility] on the 14th  Past Medical History  Diagnosis Date  . Hydradenitis   . Polycystic ovarian disease   . Depression   . Chronic back pain   . Hypothyroid   . Panic attack    Past Surgical History  Procedure Laterality Date  . Cholecystectomy    . Dilation and curettage of uterus     No family history on file. History  Substance Use Topics  . Smoking status: Current Some Day Smoker -- 0.25 packs/day for 9 years    Types: Cigarettes    Last Attempt to Quit: 03/08/2012  . Smokeless tobacco: Never Used  . Alcohol Use: Yes     Comment: rarely   Unemployed Smokes 1 ppweek  OB History    Gravida Para Term Preterm AB TAB SAB Ectopic Multiple Living   3    3  3         Review of Systems  All other systems reviewed and are  negative.     Allergies  Tramadol  Home Medications   Prior to Admission medications   Medication Sig Start Date End Date Taking? Authorizing Provider  acetaminophen (TYLENOL) 500 MG tablet Take 1,000 mg by mouth every 6 (six) hours as needed for mild pain or headache.    Historical Provider, MD  ALPRAZolam Prudy Feeler(XANAX) 0.25 MG tablet Take 1 tablet (0.25 mg total) by mouth 3 (three) times daily as needed for anxiety. 05/09/14   Loren Raceravid Yelverton, MD  diazepam (VALIUM) 5 MG tablet Take 1 tablet (5 mg total) by mouth 2 (two) times daily. Patient not taking: Reported on 05/08/2014 02/28/14   Tammi Triplett, PA-C  ibuprofen (ADVIL,MOTRIN) 200 MG tablet Take 800 mg by mouth every 6 (six) hours as needed for moderate pain. For pain    Historical Provider, MD  levothyroxine (SYNTHROID, LEVOTHROID) 75 MCG tablet Take 75 mcg by mouth daily before breakfast.    Historical Provider, MD  LORazepam (ATIVAN) 1 MG tablet Take 1 tablet (1 mg total) by mouth 3 (three) times daily as needed for anxiety. 05/08/14   John Molpus, MD  meclizine (ANTIVERT) 12.5 MG tablet Take 25 mg by mouth 3 (three) times daily as needed for dizziness.     Historical Provider, MD  meloxicam [facility](MOBIC)  15 MG tablet Take 15 mg by mouth daily as needed for pain.     Historical Provider, MD  Multiple Vitamin (MULTIVITAMIN WITH MINERALS) TABS tablet Take 1 tablet by mouth daily.    Historical Provider, MD  potassium chloride 20 MEQ TBCR Take 10 mEq by mouth daily. 05/07/14   Kristen N Ward, DO   BP 138/90 mmHg  Pulse 100  Temp(Src) 98.8 F (37.1 C)  Resp 22  Ht  (1.6 m)  Wt 325 lb (147.419 kg)  BMI 57.59 kg/m2  SpO2 100%  LMP 04/30/2014  Vital signs normal except borderline tachycardia  Physical Exam  Constitutional: She is oriented to person, place, and time. She appears well-developed and well-nourished.  Non-toxic appearance. She does not appear ill. No distress.  obese  HENT:  Head: Normocephalic and atraumatic.  Right Ear:  External ear normal.  Left Ear: External ear normal.  Nose: Nose normal. No mucosal edema or rhinorrhea.  Mouth/Throat: Oropharynx is clear and moist and mucous membranes are normal. No dental abscesses or uvula swelling.  Eyes: Conjunctivae and EOM are normal. Pupils are equal, round, and reactive to light.  Neck: Normal range of motion and full passive range of motion without pain. Neck supple.  Cardiovascular: Regular rhythm and normal heart sounds.  Tachycardia present.  Exam reveals no gallop and no friction rub.   No murmur heard. Pulmonary/Chest: Effort normal and breath sounds normal. No respiratory distress. She has no wheezes. She has no rhonchi. She has no rales. She exhibits no tenderness and no crepitus.  Abdominal: Soft. Normal appearance and bowel sounds are normal. She exhibits no distension. There is no tenderness. There is no rebound and no guarding.  Musculoskeletal: Normal range of motion. She exhibits no edema or tenderness.  Moves all extremities well.   Neurological: She is alert and oriented to person, place, and time. She has normal strength. No cranial nerve deficit.  Skin: Skin is warm, dry and intact. No rash noted. No erythema. No pallor.  Psychiatric: She has a normal mood and affect. Her speech is normal and behavior is normal. Her mood appears not anxious.  Nursing note and vitals reviewed.   ED Course  Procedures (including critical care time)  Medications  metoCLOPramide (REGLAN) injection 10 mg (10 mg Intramuscular Given 05/20/14 0650)  diphenhydrAMINE (BENADRYL) injection 50 mg (50 mg Intramuscular Given 05/20/14 0653)  ketorolac (TORADOL) injection 60 mg (60 mg Intramuscular Given 05/20/14 0655)    Patient states she is difficult to get IVs in. She elected to get her medications as IM.  Recheck at 7:30 AM patient states her headache is improved greatly. She feels ready to go home.  Labs Review Labs Reviewed - No data to display  Imaging Review No  results found.   EKG Interpretation None      MDM   Final diagnoses:  Migraine without aura and without status migrainosus, not intractable   Plan discharge  Devoria Albe, MD, Concha Pyo, MD 05/20/14 [phone]

## 2014-06-05 ENCOUNTER — Emergency Department (HOSPITAL_COMMUNITY): Payer: Self-pay

## 2014-06-05 ENCOUNTER — Emergency Department (HOSPITAL_COMMUNITY)
Admission: EM | Admit: 2014-06-05 | Discharge: 2014-06-05 | Disposition: A | Discharge status: Home / Self Care | Payer: Self-pay | Attending: Emergency Medicine | Admitting: Emergency Medicine

## 2014-06-05 ENCOUNTER — Encounter (HOSPITAL_COMMUNITY): Payer: Self-pay | Admitting: Emergency Medicine

## 2014-06-05 DIAGNOSIS — Z872 Personal history of diseases of the skin and subcutaneous tissue: Secondary | ICD-10-CM | POA: Insufficient documentation

## 2014-06-05 DIAGNOSIS — G43009 Migraine without aura, not intractable, without status migrainosus: Secondary | ICD-10-CM | POA: Insufficient documentation

## 2014-06-05 DIAGNOSIS — E039 Hypothyroidism, unspecified: Secondary | ICD-10-CM | POA: Insufficient documentation

## 2014-06-05 DIAGNOSIS — Z79899 Other long term (current) drug therapy: Secondary | ICD-10-CM | POA: Insufficient documentation

## 2014-06-05 DIAGNOSIS — G8929 Other chronic pain: Secondary | ICD-10-CM | POA: Insufficient documentation

## 2014-06-05 DIAGNOSIS — Z720 Tobacco use: Secondary | ICD-10-CM | POA: Insufficient documentation

## 2014-06-05 DIAGNOSIS — F419 Anxiety disorder, unspecified: Secondary | ICD-10-CM | POA: Insufficient documentation

## 2014-06-05 DIAGNOSIS — Z3202 Encounter for pregnancy test, result negative: Secondary | ICD-10-CM | POA: Insufficient documentation

## 2014-06-05 DIAGNOSIS — F329 Major depressive disorder, single episode, unspecified: Secondary | ICD-10-CM | POA: Insufficient documentation

## 2014-06-05 LAB — URINALYSIS, ROUTINE W REFLEX MICROSCOPIC
Bilirubin Urine: NEGATIVE
GLUCOSE, UA: NEGATIVE mg/dL
HGB URINE DIPSTICK: NEGATIVE
Ketones, ur: NEGATIVE mg/dL
Leukocytes, UA: NEGATIVE
Nitrite: NEGATIVE
Protein, ur: NEGATIVE mg/dL
Specific Gravity, Urine: <1.005 — ABNORMAL LOW (ref 1.005–1.030)
UROBILINOGEN UA: 0.2 mg/dL (ref 0.0–1.0)
pH: 6.5 (ref 5.0–8.0)

## 2014-06-05 LAB — BASIC METABOLIC PANEL
ANION GAP: 5 (ref 5–15)
BUN: 10 mg/dL (ref 6–23)
CO2: 27 mmol/L (ref 19–32)
CREATININE: 0.55 mg/dL (ref 0.50–1.10)
Calcium: 8.7 mg/dL (ref 8.4–10.5)
Chloride: 109 mmol/L (ref 96–112)
GFR calc Af Amer: >90 mL/min (ref >90)
Glucose, Bld: 96 mg/dL (ref 70–99)
POTASSIUM: 3.9 mmol/L (ref 3.5–5.1)
SODIUM: 141 mmol/L (ref 135–145)

## 2014-06-05 LAB — CBC WITH DIFFERENTIAL/PLATELET
BASOS ABS: 0 10*3/uL (ref 0.0–0.1)
Basophils Relative: 0 % (ref 0–1)
EOS ABS: 0.1 10*3/uL (ref 0.0–0.7)
Eosinophils Relative: 1 % (ref 0–5)
HCT: 40.6 % (ref 36.0–46.0)
Hemoglobin: 13.6 g/dL (ref 12.0–15.0)
LYMPHS ABS: 2 10*3/uL (ref 0.7–4.0)
Lymphocytes Relative: 25 % (ref 12–46)
MCH: 29.6 pg (ref 26.0–34.0)
MCHC: 33.5 g/dL (ref 30.0–36.0)
MCV: 88.5 fL (ref 78.0–100.0)
Monocytes Absolute: 0.5 10*3/uL (ref 0.1–1.0)
Monocytes Relative: 7 % (ref 3–12)
Neutro Abs: 5.5 10*3/uL (ref 1.7–7.7)
Neutrophils Relative %: 67 % (ref 43–77)
PLATELETS: 289 10*3/uL (ref 150–400)
RBC: 4.59 MIL/uL (ref 3.87–5.11)
RDW: 13.4 % (ref 11.5–15.5)
WBC: 8.1 10*3/uL (ref 4.0–10.5)

## 2014-06-05 LAB — POC URINE PREG, ED: PREG TEST UR: NEGATIVE

## 2014-06-05 LAB — TROPONIN I

## 2014-06-05 MED ORDER — DEXAMETHASONE SODIUM PHOSPHATE 10 MG/ML IJ SOLN
10.0000 mg | Freq: Once | INTRAMUSCULAR | Status: AC
  Administered 2014-06-05: 10 mg via INTRAMUSCULAR
  Filled 2014-06-05: qty 1

## 2014-06-05 MED ORDER — BUTALBITAL-APAP-CAFFEINE 50-325-40 MG PO TABS: 1.0000 | ORAL_TABLET | Freq: Four times a day (QID) | ORAL | Status: DC | PRN

## 2014-06-05 MED ORDER — ALPRAZOLAM 0.25 MG PO TABS: 0.2500 mg | ORAL_TABLET | Freq: Two times a day (BID) | ORAL | Status: DC | PRN

## 2014-06-05 MED ORDER — DIAZEPAM 5 MG PO TABS
5.0000 mg | ORAL_TABLET | Freq: Once | ORAL | Status: AC
  Administered 2014-06-05: 5 mg via ORAL
  Filled 2014-06-05: qty 1

## 2014-06-05 MED ORDER — DIPHENHYDRAMINE HCL 50 MG/ML IJ SOLN
50.0000 mg | Freq: Once | INTRAMUSCULAR | Status: AC
  Administered 2014-06-05: 50 mg via INTRAMUSCULAR
  Filled 2014-06-05: qty 1

## 2014-06-05 MED ORDER — METOCLOPRAMIDE HCL 5 MG/ML IJ SOLN
10.0000 mg | Freq: Once | INTRAMUSCULAR | Status: AC
  Administered 2014-06-05: 10 mg via INTRAMUSCULAR
  Filled 2014-06-05: qty 2

## 2014-06-05 NOTE — Discharge Instructions (Signed)
General Headache Without Cause °A headache is pain or discomfort felt around the head or neck area. The specific cause of a headache may not be found. There are many causes and types of headaches. A few common ones are: °· Tension headaches. °· Migraine headaches. °· Cluster headaches. °· Chronic daily headaches. °HOME CARE INSTRUCTIONS  °· Keep all follow-up appointments with your caregiver or any specialist referral. °· Only take over-the-counter or prescription medicines for pain or discomfort as directed by your caregiver. °· Lie down in a dark, quiet room when you have a headache. °· Keep a headache journal to find out what may trigger your migraine headaches. For example, write down: °¨ What you eat and drink. °¨ How much sleep you get. °¨ Any change to your diet or medicines. °· Try massage or other relaxation techniques. °· Put ice packs or heat on the head and neck. Use these 3 to 4 times per day for 15 to 20 minutes each time, or as needed. °· Limit stress. °· Sit up straight, and do not tense your muscles. °· Quit smoking if you smoke. °· Limit alcohol use. °· Decrease the amount of caffeine you drink, or stop drinking caffeine. °· Eat and sleep on a regular schedule. °· Get 7 to 9 hours of sleep, or as recommended by your caregiver. °· Keep lights dim if bright lights bother you and make your headaches worse. °SEEK MEDICAL CARE IF:  °· You have problems with the medicines you were prescribed. °· Your medicines are not working. °· You have a change from the usual headache. °· You have nausea or vomiting. °SEEK IMMEDIATE MEDICAL CARE IF:  °· Your headache becomes severe. °· You have a fever. °· You have a stiff neck. °· You have loss of vision. °· You have muscular weakness or loss of muscle control. °· You start losing your balance or have trouble walking. °· You feel faint or pass out. °· You have severe symptoms that are different from your first symptoms. °MAKE SURE YOU:  °· Understand these  instructions. °· Will watch your condition. °· Will get help right away if you are not doing well or get worse. °Document Released: 02/22/2005 Document Revised: 05/17/2011 Document Reviewed: 03/10/2011 °ExitCare® Patient Information ©2015 [employer]. This information is not intended to replace advice given to you by your health care provider. Make sure you discuss any questions you have with your health care provider. ° ° °Emergency Department Resource Guide °1) Find a Doctor and Pay Out of Pocket °Although you won't have to find out who is covered by your insurance plan, it is a good idea to ask around and get recommendations. You will then need to call the office and see if the doctor you have chosen will accept you as a new patient and what types of options they offer for patients who are self-pay. Some doctors offer discounts or will set up payment plans for their patients who do not have insurance, but you will need to ask so you aren't surprised when you get to your appointment. ° °2) Contact Your Local Health Department °Not all health departments have doctors that can see patients for sick visits, but many do, so it is worth a call to see if yours does. If you don't know where your local health department is, you can check in your phone book. The CDC also has a tool to help you locate your state's health department, and many state websites also have listings   of all of their local health departments. ° °3) Find a Walk-in Clinic °If your illness is not likely to be very severe or complicated, you may want to try a walk in clinic. These are popping up all over the country in pharmacies, drugstores, and shopping centers. They're usually staffed by nurse practitioners or physician assistants that have been trained to treat common illnesses and complaints. They're usually fairly quick and inexpensive. However, if you have serious medical issues or chronic medical problems, these are probably not your best  option. ° °No Primary Care Doctor: °- Call [facility] at  832-8000 - they can help you locate a primary care doctor that  accepts your insurance, provides certain services, etc. °- Physician Referral Service- 1-800-533-3463 ° °Chronic Pain Problems: °Organization         Address  Phone   Notes  °[facility]  (336) 297-2271 Patients need to be referred by their primary care doctor.  ° °Medication Assistance: °Organization         Address  Phone   Notes  °[place] [facility] [address] °[place], [place] [address] (336) 641-8030 --Must be a resident of [place] °-- Must have NO insurance coverage whatsoever (no Medicaid/ Medicare, etc.) °-- The pt. MUST have a primary care doctor that directs their care regularly and follows them in the community °  °[facility]  (866) 331-1348   °[employer]  (888) 892-1162   ° °Agencies that provide inexpensive medical care: °Organization         Address  Phone   Notes  °[facility]  (336) 832-8035   ° Internal Medicine    (336) 832-7272   °[facility] [address] °[place], [place] [address] (336) 832-4777   °[facility] [address], °[place] (336) 271-4999   °[facility]    (336) 373-0678   °[facility]    (336) 272-1050   °[facility] ° [address], [place] Phone:  (336) 832-4444, Fax:  (336) 832-4440 Hours of Operation:  9 am - 6 pm, M-F.  Also accepts Medicaid/Medicare and self-pay.  °[facility] ° [address], [place] Phone: (336) 832-3150, Fax: (336) 832-3151. Hours of Operation:  8:30 am - 5:30 pm, M-F.  Also accepts Medicaid and self-pay.  °[facility] [place] [address], [place] Phone: (336) 878-6027   °[facility] [address], [place], [place] (336)723-1848, Ext. 123 Mondays & Thursdays: 7-9 AM.  First 15  patients are seen on a first come, first serve basis. °  ° °Medicaid-accepting [place] Providers: ° °Organization         Address  Phone   Notes  °[facility] [address], [place] (336) 641-2100 Also accepts self-pay patients.  °[facility] [address], [place] ° (336) 856-9996   °[facility] [address], [place] (336) 288-8857   °[facility] [address] [place] [address], [place] (336) 299-7000   °Veita Bland [address], [place]  ° (336) 373-1557 Only accepts [place] Access Medicaid patients after they have their name applied to their card.  ° °Self-Pay (no insurance) in [place]: ° °Organization         Address  Phone   Notes  °  Sickle Cell Patients, [facility] [address], [place] (336) 832-1970   °[facility] [address], [place] (336) 832-4400   °[facility] ° [address], [place] (336) 992-4800   °[facility]/Dr. Osei-Bonsu ° [address] [place] [address], [place] or [address], [place] (336) 841-8500 Phone number for both [place] and [place] locations is the same.  °[facility] [address], [place] (336) 299-0000   °[facility] [address] [place] [address], [place] or [address] (336) 852-7530 °(336) 878-2260   °[facility] [address], [place] (336) 350-1642, phone; (336) 294-5005, fax Sees patients 1st and 3rd Saturday of every month.  Must not qualify for public or private insurance (i.e. Medicaid, Medicare, [place] Health Choice, Veterans' Benefits) • Household income should be no more than 200% of the poverty level •The clinic cannot treat you if you are pregnant or think you are pregnant • Sexually transmitted diseases are not treated at the clinic.  ° ° °Dental  Care: °Organization         Address  Phone  Notes  °[facility] [address], [place] (336) 641-6152 Accepts children up to age 21 who are enrolled in Medicaid or [place] Health Choice; pregnant women with a Medicaid card; and children who have applied for Medicaid or [place] Health Choice, but were declined, whose parents can pay a reduced fee at time of service.  °[facility] [place]  [address], [place] (336) 641-7733 Accepts children up to age 21 who are enrolled in Medicaid or [place] Health Choice; pregnant women with a Medicaid card; and children who have applied for Medicaid or [place] Health Choice, but were declined, whose parents can pay a reduced fee at time of service.  °[facility] ° [address], [place] (336) 641-4533 Patients are seen by appointment only. Walk-ins are not accepted. [facility] will see patients 18 years of age and older. °Monday - Tuesday (8am-5pm) °Most Wednesdays (8:30-5pm) °$30 per visit, cash only  °[facility] ° [address], [place] (336) 641-4533 Patients are seen by appointment only. Walk-ins are not accepted. [facility] will see patients 18 years of age and older. °One Wednesday Evening (Monthly: Volunteer Based).  $30 per visit, cash only  °[facility]  (919) 537-3737 for adults; Children under age 4, call Graduate Pediatric Dentistry at ([phone]) [phone]. Children aged 4-14, please call ([phone]) [phone] to request a pediatric application. ° Dental services are provided in all areas of dental care including fillings, crowns and bridges, complete and partial dentures, implants, gum treatment, root canals, and extractions. Preventive care is also provided. Treatment is provided to both adults and children. °Patients are selected via a lottery and there is often a waiting list. °  °[facility] [address], °[place] ° (336) 763-8833 [email or web address] °  °[facility] [address], [place], [place] (336)723-1848, Ext. 123 Second and Fourth Thursday of each month, opens at 6:30 AM; Clinic ends at 9 AM.  Patients are seen on a first-come first-served basis, and a limited number are seen during each clinic.  ° °[facility] ° [address], [place], [place] (336) 723-7904   Eligibility   Requirements °You must have lived in [place], [place], or [place] counties for at least the last three months. °  You cannot be eligible for state or federal sponsored healthcare insurance, including Veterans Administration, Medicaid, or Medicare. °  You generally cannot be eligible for healthcare insurance through your employer.  °  How to apply: °Eligibility screenings are held every Tuesday and Wednesday afternoon from 1:00 pm until 4:00 pm. You do not need an appointment for the interview!  °[facility] [address], [place], [place] 336-631-2330   °[facility]  336-342-8273   °[facility]  336-703-3100   °[facility]  336-570-6415   ° °Behavioral Health Resources in the Community: °Intensive Outpatient Programs °Organization         Address  Phone  Notes  °[facility] [address], [place], [place] 336-878-6098   °[facility] [address], [place], [place] 336-832-9800   °[facility] [address], [place], [place] ° 336-882-2125   °[facility] [address],  °[place], [place] 1-800-853-5163 or 336-641-4981   °Substance Abuse Resources °Organization         Address  Phone  Notes  °[facility]  336-882-2125   °[facility]  336-784-9470   °[facility]  336-285-9073   °[facility]  336-845-3988   °Residential & Outpatient Substance Abuse Program  1-800-659-3381    °Psychological Services °Organization         Address  Phone  Notes  °[facility]  336- 832-9600   °[facility]  336- 378-7881   °[facility] [address], [place] 1-800-853-5163 or 336-641-4981   ° °Mobile Crisis Teams °Organization         Address  Phone  Notes  °[facility], Mobile Crisis Care Unit  1-877-626-1772   °Assertive °[facility] ° [address] [place], [place] 336-834-9664   °Sharon DeEsch [address] °[place] 336-554-5454   ° °Self-Help/Support Groups °Organization         Address  Phone             Notes  °[facility] [place] - variety of support groups  336- 373-1402 Call for more information  °Narcotics Anonymous (NA), [facility] [address], °[place]  2 meetings at this location  ° °Residential Treatment Programs °Organization         Address  Phone  Notes  °[facility] [address],    °[place]  1-866-801-8205   °[facility] ° [address], [place], [place] 704-293-8524   °[facility] [address], [place] 336-845-3988 Admissions: 8am-3pm M-F  °[facility] [address]    °[place], [place] 336-841-1104   °[facility] [address], [place], [place] 336-379-7146   °[facility] [address]  °[place], [place] 336-285-9073   °[facility] Intensive Outpatient [address], [place], [place] 336-852-3033   °[facility] [address]  °[place], [place] 1-877-615-2722 or 336-784-9470   °[facility] [address] [place], [place] 336-227-7417 Accepts Medicaid  °[facility] [address]  °[place]  1-800-659-3381 Substance Abuse/Addiction Treatment  ° °[place] Behavioral Health Resources °Organization           Address  Phone  Notes  [facility]  (802)466-4997(888) 9181615357   Angie FavaJulie Brannon, PhD [address], [address] [place], [place]   ([phone])[phone](336) [phone] or [phone](336) ([phone]   [facility]   [address] [place], [place] 830-836-7305(336) 830-350-1034   [facility] [address], [place], [place] (805) 668-3397(336) (301) [phone] Insurance/Medicaid/sponsorship through [facility] [address]                                    [place], [place] (210) [phone](336) (301) [phone] Therapy/tele-psych/case  [facility] [address].   [place], [place] ([phone](336) ([phone]    Dr. Lolly MustacheArfeen  [phone](336) [phone]   [facility]  [employer] [facility] 1) [address], [place] 2) [address], [place] 3)  [address], [place] 606-268-5180(336) 862-849-0480 [phone](336) [phone]  ([phone]) [phone](336) [phone]   [facility] 239-458-3860(336) 2361330683 or 603-087-0387(336) 604-049-7033 (After Hours)      You may try the medicine prescribed for return of headache - use caution as this can make you sleepy.  Your labs, ekg, xrays and CT scan are negative tonight for the source of your multiple symptoms.  You need to establish care with a primary doctor who can help you further with your symptoms.

## 2014-06-05 NOTE — ED Notes (Signed)
Intermittent multiple symptoms x 2 weeks, blurred vision, headache, dry cough, random shooting pain in chest, popping in ears, treated for sinus infection. Pt has taking multiple OTC without relief

## 2014-06-06 NOTE — ED Provider Notes (Signed)
CSN: [id number]     Arrival date & time 06/05/14  1818 History   First MD Initiated Contact with Patient 06/05/14 1830     Chief Complaint  Patient presents with  . Headache     (Consider location/radiation/quality/duration/timing/severity/associated sxs/prior Treatment) The history is provided by the patient and the spouse.   Laurie Macias is a 29 y.o. female with a history of hypothyroidism, PCOS, depression and panic attacks and headaches presenting with multiple complaints including escalating headaches, describing a pressure and squeezing sensation around her head, sometimes accompanied with a sore scalp and pressure behind her eyes.  She also endorses mild light and sound sensitivity when the headache is at its worst. Her headaches seem worse toward the end of the day. She reports increasing difficulty with distance vision, no problems with near vision and is arranging a formal eye exam.  Her pcp (Dr. Roseanne Reno in [place]) has suggested neurologist referral.  Pt does not currently have insurance so this has been pending.  Also has been unable to f/u with her pcp due to cost and transportation issues.    Also describes intermittent sharp pain midsternum which is worsened with movement and palpation.  She denies sob,  Diaphoresis, cough.  Symptoms are not exertional. Has had increased anxiety with panic episodes which she has used xanax in the past for episodes.  Has been seen by Dr. Geanie Cooley with [facility] who recently took her off her celexa, felt to be source of her prolonged QT and her hypokalemia which she was seen here for several visits ago.  She denies worsened depression since being off this medicine.  Also describes persistent bilateral ear pressure despite completing a course of antibotics for sinusitis earlier this month.  She denies fevers, chills, facial pain or pressure, no ear or nasal discharge or decreased hearing acuity.    Past Medical History  Diagnosis Date  . Hydradenitis     . Polycystic ovarian disease   . Depression   . Chronic back pain   . Hypothyroid   . Panic attack    Past Surgical History  Procedure Laterality Date  . Cholecystectomy    . Dilation and curettage of uterus     No family history on file. History  Substance Use Topics  . Smoking status: Current Some Day Smoker -- 0.25 packs/day for 9 years    Types: Cigarettes    Last Attempt to Quit: 03/08/2012  . Smokeless tobacco: Never Used  . Alcohol Use: Yes     Comment: rarely   OB History    Gravida Para Term Preterm AB TAB SAB Ectopic Multiple Living   Review of Systems  Constitutional: Negative for fever and chills.  HENT: Negative for congestion, facial swelling, hearing loss, rhinorrhea, sinus pressure and sore throat.   Eyes: Positive for photophobia and visual disturbance.  Respiratory: Negative for cough, chest tightness and shortness of breath.   Cardiovascular: Positive for chest pain.  Gastrointestinal: Negative for nausea, vomiting and abdominal pain.  Genitourinary: Negative.   Musculoskeletal: Negative for joint swelling, arthralgias and neck pain.  Skin: Negative.  Negative for rash and wound.  Neurological: Positive for headaches. Negative for dizziness, weakness, light-headedness and numbness.  Psychiatric/Behavioral: Negative.       Allergies  Tramadol  Home Medications   Prior to Admission medications   Medication Sig Start Date End Date Taking? Authorizing Provider  5-Hydroxytryptophan (5-HTP) 100 MG  CAPS Take 1 capsule by mouth daily.   Yes Historical Provider, MD  acetaminophen (TYLENOL) 500 MG tablet Take 1,000 mg by mouth every 6 (six) hours as needed for mild pain or headache.   Yes Historical Provider, MD  diphenhydrAMINE (BENADRYL) 25 mg capsule Take 25 mg by mouth every 6 (six) hours as needed for itching or allergies.   Yes Historical Provider, MD  ibuprofen (ADVIL,MOTRIN) 200 MG tablet Take 800 mg by mouth every 6 (six) hours  as needed for moderate pain. For pain   Yes Historical Provider, MD  levothyroxine (SYNTHROID, LEVOTHROID) 75 MCG tablet Take 75 mcg by mouth daily before breakfast.   Yes Historical Provider, MD  loratadine (CLARITIN) 10 MG tablet Take 10 mg by mouth daily as needed for allergies.   Yes Historical Provider, MD  Multiple Vitamin (MULTIVITAMIN WITH MINERALS) TABS tablet Take 1 tablet by mouth daily.   Yes Historical Provider, MD  Potassium (POTASSIMIN PO) Take 1 tablet by mouth daily.   Yes Historical Provider, MD  ALPRAZolam (XANAX) 0.25 MG tablet Take 1 tablet (0.25 mg total) by mouth 2 (two) times daily as needed for anxiety. 06/05/14   Burgess AmorJulie Malya Cirillo, PA-C  butalbital-acetaminophen-caffeine (FIORICET) [phone] MG per tablet Take 1 tablet by mouth every 6 (six) hours as needed for headache. 06/05/14 06/05/15  Burgess AmorJulie Cornelis Kluver, PA-C  LORazepam (ATIVAN) 1 MG tablet Take 1 tablet (1 mg total) by mouth 3 (three) times daily as needed for anxiety. Patient not taking: Reported on 06/05/2014 05/08/14   Paula LibraJohn Molpus, MD  potassium chloride 20 MEQ TBCR Take 10 mEq by mouth daily. Patient not taking: Reported on 06/05/2014 05/07/14   Kristen N Ward, DO   BP 114/48 mmHg  Pulse 86  Temp(Src) 98.4 F (36.9 C) (Oral)  Resp 20  Ht 5\' 3"  (1.6 m)  Wt 320 lb (145.151 kg)  BMI 56.70 kg/m2  SpO2 98%  LMP 05/26/2014 Physical Exam  Constitutional: She is oriented to person, place, and time. She appears well-developed and well-nourished.  Morbidly obese  HENT:  Head: Normocephalic and atraumatic.  Right Ear: Tympanic membrane and ear canal normal.  Left Ear: Tympanic membrane and ear canal normal.  Mouth/Throat: Uvula is midline, oropharynx is clear and moist and mucous membranes are normal.  Eyes: EOM are normal. Pupils are equal, round, and reactive to light.  Neck: Normal range of motion. Neck supple. No spinous process tenderness and no muscular tenderness present.  Cardiovascular: Normal rate and normal heart sounds.     Pulmonary/Chest: Effort normal. She exhibits tenderness.    Abdominal: Soft. There is no tenderness.  Musculoskeletal: Normal range of motion.  Lymphadenopathy:    She has no cervical adenopathy.  Neurological: She is alert and oriented to person, place, and time. She has normal strength. No cranial nerve deficit or sensory deficit. Coordination and gait normal. GCS eye subscore is 4. GCS verbal subscore is 5. GCS motor subscore is 6.  Cranial nerves III-XII intact.  No pronator drift.  Skin: Skin is warm and dry. No rash noted.  Psychiatric: She has a normal mood and affect. Her speech is normal and behavior is normal. Thought content normal. Cognition and memory are normal.  Nursing note and vitals reviewed.   ED Course  Procedures (including critical care time) Labs Review Labs Reviewed  URINALYSIS, ROUTINE W REFLEX MICROSCOPIC - Abnormal; Notable for the following:    Color, Urine STRAW (*)    Specific Gravity, Urine <1.005 (*)    All other  components within normal limits  CBC WITH DIFFERENTIAL/PLATELET  TROPONIN I  BASIC METABOLIC PANEL  POC URINE PREG, ED    Imaging Review Dg Chest 2 View  06/05/2014   CLINICAL DATA:  Headache, cough, intermittent chest pain for 2 weeks.  EXAM: CHEST  2 VIEW  COMPARISON:  08/02/2005  FINDINGS: The cardiomediastinal contours are normal. Mild eventration of right hemidiaphragm. Central bronchial thickening. Pulmonary vasculature is normal. No consolidation, pleural effusion, or pneumothorax. No acute osseous abnormalities are seen.  IMPRESSION: Central bronchial thickening, can be seen with bronchitis or asthma. No localizing pulmonary process.   Electronically Signed   By: Rubye Oaks M.D.   On: 06/05/2014 19:14   Ct Head Wo Contrast  06/05/2014   CLINICAL DATA:  Subacute onset of blurred vision, headache, dry cough and shooting pain in the chest. Popping in the ears. Initial encounter.  EXAM: CT HEAD WITHOUT CONTRAST  TECHNIQUE:  Contiguous axial images were obtained from the base of the skull through the vertex without intravenous contrast.  COMPARISON:  None.  FINDINGS: There is no evidence of acute infarction, mass lesion, or intra- or extra-axial hemorrhage on CT.  The posterior fossa, including the cerebellum, brainstem and fourth ventricle, is within normal limits. The third and lateral ventricles, and basal ganglia are unremarkable in appearance. The cerebral hemispheres are symmetric in appearance, with normal gray-white differentiation. No mass effect or midline shift is seen.  There is no evidence of fracture; visualized osseous structures are unremarkable in appearance. The visualized portions of the orbits are within normal limits. The paranasal sinuses and mastoid air cells are well-aerated. No significant soft tissue abnormalities are seen.  IMPRESSION: Unremarkable noncontrast CT of the head.   Electronically Signed   By: Roanna Raider M.D.   On: 06/05/2014 23:09     EKG Interpretation   Date/Time:  Wednesday June 05 2014 18:59:55 EDT Ventricular Rate:  93 PR Interval:  159 QRS Duration: 84 QT Interval:  412 QTC Calculation: 512 R Axis:   32 Text Interpretation:  Sinus rhythm Borderline T wave abnormalities  Prolonged QT interval Baseline wander in lead(s) V3 No significant change  since last tracing Confirmed by WARD,  DO, KRISTEN [phone]) on 06/05/2014  7:02:37 PM      MDM   Final diagnoses:  Anxiety  Migraine without aura and without status migrainosus, not intractable    Patients labs and/or radiological studies were reviewed and considered during the medical decision making and disposition process.  Results were also discussed with patient.   Pt with multiple complaints without obvious source.  She was counseled that she needs a pcp who can assist her better with her multiple concerns than the emergency dept is able to provide.  She was given referral to the [facility] clinic, other resources  also provided to establish local primary care.  She was given small prescription for xanax to help with anxiety which I suspect may be the source of at least some of her complaints.  Proceed with formal eye exam. Headaches may be migraine vs stress - trial of fioricet suggested - prescription given.    The patient appears reasonably screened and/or stabilized for discharge and I doubt any other medical condition or other [facility] requiring further screening, evaluation, or treatment in the ED at this time prior to discharge.     Burgess Amor, PA-C 06/06/14 1339  Layla Maw Ward, DO 06/07/14 2329

## 2014-06-19 ENCOUNTER — Encounter (HOSPITAL_COMMUNITY): Payer: Self-pay | Admitting: *Deleted

## 2014-06-19 ENCOUNTER — Emergency Department (HOSPITAL_COMMUNITY)
Admission: EM | Admit: 2014-06-19 | Discharge: 2014-06-20 | Disposition: A | Discharge status: Home / Self Care | Payer: Self-pay | Attending: Emergency Medicine | Admitting: Emergency Medicine

## 2014-06-19 DIAGNOSIS — Z720 Tobacco use: Secondary | ICD-10-CM | POA: Insufficient documentation

## 2014-06-19 DIAGNOSIS — E039 Hypothyroidism, unspecified: Secondary | ICD-10-CM | POA: Insufficient documentation

## 2014-06-19 DIAGNOSIS — M542 Cervicalgia: Secondary | ICD-10-CM | POA: Insufficient documentation

## 2014-06-19 DIAGNOSIS — Z872 Personal history of diseases of the skin and subcutaneous tissue: Secondary | ICD-10-CM | POA: Insufficient documentation

## 2014-06-19 DIAGNOSIS — R519 Headache, unspecified: Secondary | ICD-10-CM

## 2014-06-19 DIAGNOSIS — H8110 Benign paroxysmal vertigo, unspecified ear: Secondary | ICD-10-CM | POA: Insufficient documentation

## 2014-06-19 DIAGNOSIS — F410 Panic disorder [episodic paroxysmal anxiety] without agoraphobia: Secondary | ICD-10-CM | POA: Insufficient documentation

## 2014-06-19 DIAGNOSIS — F329 Major depressive disorder, single episode, unspecified: Secondary | ICD-10-CM | POA: Insufficient documentation

## 2014-06-19 DIAGNOSIS — Z79899 Other long term (current) drug therapy: Secondary | ICD-10-CM | POA: Insufficient documentation

## 2014-06-19 DIAGNOSIS — G8929 Other chronic pain: Secondary | ICD-10-CM | POA: Insufficient documentation

## 2014-06-19 DIAGNOSIS — R51 Headache: Secondary | ICD-10-CM | POA: Insufficient documentation

## 2014-06-19 NOTE — ED Provider Notes (Signed)
CSN: [id number]     Arrival date & time 06/19/14  2108 History   First MD Initiated Contact with Patient 06/19/14 2335    This chart was scribed for Laurie Kaplan, MD by Marica Otter, ED Scribe. This patient was seen in room [facility]08/[facility]08 and the patient's care was started at 12:38 AM.  Chief Complaint  Patient presents with  . Headache   The history is provided by the patient. No language interpreter was used.   PCP: Quitman Livings, MD HPI Comments: Laurie Macias is a 29 y.o. female, with PMH noted below including tobacco use, who presents to the Emergency Department complaining of ongoing, unimproved, intermittent headaches onset one month ago with associated radiating neck pain in the submandibular region radiating to the shoulders and head onset 6 days ago. Pt also complains of associated dizziness. Pt denies DM, HTN, elevated BP, current drug use (though pt reports she tried MDMA 1.5 months ago), new numbness/tingling on one side or bilaterally (pt notes some numbness to her hands at baseline).  Past Medical History  Diagnosis Date  . Hydradenitis   . Polycystic ovarian disease   . Depression   . Chronic back pain   . Hypothyroid   . Panic attack    Past Surgical History  Procedure Laterality Date  . Cholecystectomy    . Dilation and curettage of uterus     History reviewed. No pertinent family history. History  Substance Use Topics  . Smoking status: Current Some Day Smoker -- 0.25 packs/day for 9 years    Types: Cigarettes    Last Attempt to Quit: 03/08/2012  . Smokeless tobacco: Never Used  . Alcohol Use: Yes     Comment: rarely   OB History    Gravida Para Term Preterm AB TAB SAB Ectopic Multiple Living   Review of Systems  Constitutional: Negative for fever and chills.  Musculoskeletal: Positive for neck pain.  Neurological: Positive for dizziness, speech difficulty and headaches. Negative for numbness.  Psychiatric/Behavioral: Negative for  confusion.    Allergies  Tramadol and Ativan  Home Medications   Prior to Admission medications   Medication Sig Start Date End Date Taking? Authorizing Provider  5-Hydroxytryptophan (5-HTP) 100 MG CAPS Take 1 capsule by mouth daily.    Historical Provider, MD  acetaminophen (TYLENOL) 500 MG tablet Take 1,000 mg by mouth every 6 (six) hours as needed for mild pain or headache.    Historical Provider, MD  ALPRAZolam Prudy Feeler) 0.25 MG tablet Take 1 tablet (0.25 mg total) by mouth 2 (two) times daily as needed for anxiety. 06/05/14   Burgess Amor, PA-C  butalbital-acetaminophen-caffeine (FIORICET) [phone] MG per tablet Take 1 tablet by mouth every 6 (six) hours as needed for headache. 06/05/14 06/05/15  Burgess Amor, PA-C  diphenhydrAMINE (BENADRYL) 25 mg capsule Take 25 mg by mouth every 6 (six) hours as needed for itching or allergies.    Historical Provider, MD  HYDROcodone-acetaminophen (NORCO/VICODIN) 5-325 MG per tablet Take 1 tablet by mouth every 6 (six) hours as needed. 06/20/14   Laurie Kaplan, MD  ibuprofen (ADVIL,MOTRIN) 600 MG tablet Take 1 tablet (600 mg total) by mouth every 6 (six) hours as needed. 06/20/14   Laurie Kaplan, MD  levothyroxine (SYNTHROID, LEVOTHROID) 75 MCG tablet Take 75 mcg by mouth daily before breakfast.    Historical Provider, MD  loratadine (CLARITIN) 10 MG tablet Take 10 mg by mouth daily as needed  for allergies.    Historical Provider, MD  LORazepam (ATIVAN) 1 MG tablet Take 1 tablet (1 mg total) by mouth 3 (three) times daily as needed for anxiety. Patient not taking: Reported on 06/05/2014 05/08/14   Paula LibraJohn Molpus, MD  meclizine (ANTIVERT) 32 MG tablet Take 1 tablet (32 mg total) by mouth 3 (three) times daily as needed. 06/20/14   Laurie KaplanAnkit Ercia Crisafulli, MD  Multiple Vitamin (MULTIVITAMIN WITH MINERALS) TABS tablet Take 1 tablet by mouth daily.    Historical Provider, MD  Potassium (POTASSIMIN PO) Take 1 tablet by mouth daily.    Historical Provider, MD  potassium chloride  20 MEQ TBCR Take 10 mEq by mouth daily. Patient not taking: Reported on 06/05/2014 05/07/14   Layla MawKristen N Ward, DO   Triage Vitals: BP 142/90 mmHg  Pulse 113  Temp(Src) 99 F (37.2 C) (Oral)  Resp 20  Ht 5\' 3"  (1.6 m)  Wt 320 lb (145.151 kg)  BMI 56.70 kg/m2  SpO2 100%  LMP 06/12/2014 Physical Exam  Constitutional: She is oriented to person, place, and time. She appears well-developed and well-nourished. No distress.  HENT:  Head: Normocephalic and atraumatic.  Right Ear: External ear normal.  Left Ear: External ear normal.  Bilateral ear exam is clear   Eyes: Conjunctivae and EOM are normal.  Neck: Neck supple. No JVD present. Carotid bruit is not present.  Cardiovascular: Normal rate and regular rhythm.   Pulmonary/Chest: Effort normal and breath sounds normal. No respiratory distress.  Lungs clear to osculation   Musculoskeletal: Normal range of motion.  Neurological: She is alert and oriented to person, place, and time.  Sensory strengths for UE normal. Cranials 2-12 intact. 4/5 strength.   cerebellar exam nose to finger is clear.   Negative hints exam.   Skin: Skin is warm and dry.  Psychiatric: She has a normal mood and affect. Her behavior is normal.  Nursing note and vitals reviewed.   ED Course  Procedures (including critical care time) DIAGNOSTIC STUDIES: Oxygen Saturation is 100% on RA, nl by my interpretation.    COORDINATION OF CARE: 12:38 AM-Discussed treatment plan  with pt at bedside and pt agreed to plan.   Labs Review Labs Reviewed  CBC WITH DIFFERENTIAL/PLATELET - Abnormal; Notable for the following:    WBC 11.1 (*)    All other components within normal limits  BASIC METABOLIC PANEL - Abnormal; Notable for the following:    Glucose, Bld 117 (*)    All other components within normal limits    Imaging Review Ct Angio Head W/cm &/or Wo Cm  06/20/2014   CLINICAL DATA:  Initial evaluation for ongoing intermittent headaches with radiation to neck.   EXAM: CT ANGIOGRAPHY HEAD AND NECK  TECHNIQUE: Multidetector CT imaging of the head and neck was performed using the standard protocol during bolus administration of intravenous contrast. Multiplanar CT image reconstructions and MIPs were obtained to evaluate the vascular anatomy. Carotid stenosis measurements (when applicable) are obtained utilizing NASCET criteria, using the distal internal carotid diameter as the denominator.  CONTRAST:  100mL OMNIPAQUE IOHEXOL 350 MG/ML SOLN  COMPARISON:  Prior CT from 06/05/2014.  FINDINGS: CT HEAD  There is no acute intracranial hemorrhage or infarct. No mass lesion or midline shift. Gray-white matter differentiation is well maintained. Ventricles are normal in size without evidence of hydrocephalus. CSF containing spaces are within normal limits. No extra-axial fluid collection.  The calvarium is intact.  Orbital soft tissues are within normal limits.  The paranasal sinuses and  mastoid air cells are well pneumatized and free of fluid.  Scalp soft tissues are unremarkable.  CTA NECK  Study is limited by patient body habitus.  Aortic arch: Visualized aortic arch is of normal caliber and appearance. No definite high-grade stenosis seen at the origin of the great vessels. Partially visualized subclavian arteries grossly unremarkable.  Right carotid system: Proximal right common carotid artery not well evaluated due to body habitus. The mid and distal right common carotid artery is well opacified without significant stenosis or other acute abnormality. Right carotid bifurcation unremarkable. Right internal carotid artery well opacified to the skullbase without evidence for dissection, occlusion, or stenosis.  Left carotid system: Proximal left common carotid artery not well evaluated due to body habitus. The mid and distal left common carotid artery is well opacified without significant stenosis or other acute abnormality. Left carotid bifurcation normal. Left internal carotid  artery well opacified without evidence for stenosis, dissection, or other acute abnormality.  Vertebral arteries:Proximal vertebral arteries not well evaluated due to body habitus. The mid and distal vertebral arteries are well opacified without evidence for dissection or other acute abnormality.  Skeleton: Mild reversal of the normal cervical lordosis noted, which may be related to patient positioning. No acute osseus abnormality. No worrisome lytic or blastic osseous lesion.  Other neck: No acute soft tissue abnormality within the neck.  CTA HEAD  Anterior circulation: The petrous, cavernous, and supra clinoid segments of the internal carotid arteries are well opacified bilaterally without significant stenosis or other abnormality. A1 segments, anterior communicating artery, and anterior cerebral arteries are well opacified.  M1 segments widely patent without proximal branch occlusion, stenosis, or other acute abnormality. MCA bifurcations normal. Distal MCA branches symmetric and appearance.  Posterior circulation: Both vertebral arteries are well opacified to the vertebrobasilar junction. Visualized posterior inferior cerebral arteries are unremarkable. Basilar artery widely patent. Superior cerebellar arteries are patent proximally. Posterior cerebral arteries opacified bilaterally.  Venous sinuses: No acute abnormality identified within the venous sinuses.  Anatomic variants: No anatomic variant. No aneurysm or vascular malformation.  Delayed phase: No abnormal enhancement on delayed sequences.  IMPRESSION: 1. No acute intracranial process. 2. Somewhat limited CTA of the head and neck due to body habitus with no definite acute abnormality identified. No significant stenosis, arterial occlusion, or evidence for dissection. No aneurysm.   Electronically Signed   By: Rise Mu M.D.   On: 06/20/2014 03:47   Ct Angio Neck W/cm &/or Wo/cm  06/20/2014   CLINICAL DATA:  Initial evaluation for ongoing  intermittent headaches with radiation to neck.  EXAM: CT ANGIOGRAPHY HEAD AND NECK  TECHNIQUE: Multidetector CT imaging of the head and neck was performed using the standard protocol during bolus administration of intravenous contrast. Multiplanar CT image reconstructions and MIPs were obtained to evaluate the vascular anatomy. Carotid stenosis measurements (when applicable) are obtained utilizing NASCET criteria, using the distal internal carotid diameter as the denominator.  CONTRAST:  OMNIPAQUE IOHEXOL 350 MG/ML SOLN  COMPARISON:  Prior CT from 06/05/2014.  FINDINGS: CT HEAD  There is no acute intracranial hemorrhage or infarct. No mass lesion or midline shift. Gray-white matter differentiation is well maintained. Ventricles are normal in size without evidence of hydrocephalus. CSF containing spaces are within normal limits. No extra-axial fluid collection.  The calvarium is intact.  Orbital soft tissues are within normal limits.  The paranasal sinuses and mastoid air cells are well pneumatized and free of fluid.  Scalp soft tissues are unremarkable.  CTA NECK  Study is limited by patient body habitus.  Aortic arch: Visualized aortic arch is of normal caliber and appearance. No definite high-grade stenosis seen at the origin of the great vessels. Partially visualized subclavian arteries grossly unremarkable.  Right carotid system: Proximal right common carotid artery not well evaluated due to body habitus. The mid and distal right common carotid artery is well opacified without significant stenosis or other acute abnormality. Right carotid bifurcation unremarkable. Right internal carotid artery well opacified to the skullbase without evidence for dissection, occlusion, or stenosis.  Left carotid system: Proximal left common carotid artery not well evaluated due to body habitus. The mid and distal left common carotid artery is well opacified without significant stenosis or other acute abnormality. Left  carotid bifurcation normal. Left internal carotid artery well opacified without evidence for stenosis, dissection, or other acute abnormality.  Vertebral arteries:Proximal vertebral arteries not well evaluated due to body habitus. The mid and distal vertebral arteries are well opacified without evidence for dissection or other acute abnormality.  Skeleton: Mild reversal of the normal cervical lordosis noted, which may be related to patient positioning. No acute osseus abnormality. No worrisome lytic or blastic osseous lesion.  Other neck: No acute soft tissue abnormality within the neck.  CTA HEAD  Anterior circulation: The petrous, cavernous, and supra clinoid segments of the internal carotid arteries are well opacified bilaterally without significant stenosis or other abnormality. A1 segments, anterior communicating artery, and anterior cerebral arteries are well opacified.  M1 segments widely patent without proximal branch occlusion, stenosis, or other acute abnormality. MCA bifurcations normal. Distal MCA branches symmetric and appearance.  Posterior circulation: Both vertebral arteries are well opacified to the vertebrobasilar junction. Visualized posterior inferior cerebral arteries are unremarkable. Basilar artery widely patent. Superior cerebellar arteries are patent proximally. Posterior cerebral arteries opacified bilaterally.  Venous sinuses: No acute abnormality identified within the venous sinuses.  Anatomic variants: No anatomic variant. No aneurysm or vascular malformation.  Delayed phase: No abnormal enhancement on delayed sequences.  IMPRESSION: 1. No acute intracranial process. 2. Somewhat limited CTA of the head and neck due to body habitus with no definite acute abnormality identified. No significant stenosis, arterial occlusion, or evidence for dissection. No aneurysm.   Electronically Signed   By: Rise Mu M.D.   On: 06/20/2014 03:47     EKG Interpretation None      MDM    Final diagnoses:  BPPV (benign paroxysmal positional vertigo), unspecified laterality  Chronic daily headache    I personally performed the services described in this documentation, which was scribed in my presence. The recorded information has been reviewed and is accurate.  Pt comes in with cc of headache, neck pain and dizziness. She has hx of PCOS - which puts patient at risk for increased headache, but also increased thromboembolisms. The headache in itself is not concerning - but the new neck pain and dizziness -described as vertigo and sometimes feeling like she is "drunk" is concerning. She has a normal neuro exam. HINTs exam not helping isolate this into a peripheral vertigo. Pt has no neurologist, and no financial means to f/u. Given the risk of thrombosis- and no clear etiology, CT angio done, and is neg for any thrombosis. PT given meds to treat her sx as BPPV. STRONGLY urged her to see a neurologist.   Laurie Kaplan, MD 06/20/14 [phone]

## 2014-06-19 NOTE — ED Notes (Signed)
Headache, and neck pain for 1 month, nausea, no vomiting, blurred vision.  No HI.

## 2014-06-20 ENCOUNTER — Emergency Department (HOSPITAL_COMMUNITY): Payer: Self-pay

## 2014-06-20 LAB — CBC WITH DIFFERENTIAL/PLATELET
BASOS PCT: 0 % (ref 0–1)
Basophils Absolute: 0 10*3/uL (ref 0.0–0.1)
EOS PCT: 1 % (ref 0–5)
Eosinophils Absolute: 0.1 10*3/uL (ref 0.0–0.7)
HCT: 40.1 % (ref 36.0–46.0)
Hemoglobin: 13.5 g/dL (ref 12.0–15.0)
LYMPHS ABS: 3.2 10*3/uL (ref 0.7–4.0)
LYMPHS PCT: 29 % (ref 12–46)
MCH: 29.7 pg (ref 26.0–34.0)
MCHC: 33.7 g/dL (ref 30.0–36.0)
MCV: 88.1 fL (ref 78.0–100.0)
MONOS PCT: 6 % (ref 3–12)
Monocytes Absolute: 0.6 10*3/uL (ref 0.1–1.0)
Neutro Abs: 7.1 10*3/uL (ref 1.7–7.7)
Neutrophils Relative %: 64 % (ref 43–77)
Platelets: 260 10*3/uL (ref 150–400)
RBC: 4.55 MIL/uL (ref 3.87–5.11)
RDW: 13.3 % (ref 11.5–15.5)
WBC: 11.1 10*3/uL — AB (ref 4.0–10.5)

## 2014-06-20 LAB — BASIC METABOLIC PANEL
ANION GAP: 7 (ref 5–15)
BUN: 6 mg/dL (ref 6–23)
CO2: 27 mmol/L (ref 19–32)
CREATININE: 0.6 mg/dL (ref 0.50–1.10)
Calcium: 8.6 mg/dL (ref 8.4–10.5)
Chloride: 106 mmol/L (ref 96–112)
GFR calc Af Amer: >90 mL/min (ref >90)
Glucose, Bld: 117 mg/dL — ABNORMAL HIGH (ref 70–99)
Potassium: 3.8 mmol/L (ref 3.5–5.1)
SODIUM: 140 mmol/L (ref 135–145)

## 2014-06-20 MED ORDER — MECLIZINE HCL 32 MG PO TABS: 32.0000 mg | ORAL_TABLET | Freq: Three times a day (TID) | ORAL | Status: DC | PRN

## 2014-06-20 MED ORDER — IBUPROFEN 600 MG PO TABS: 600.0000 mg | ORAL_TABLET | Freq: Four times a day (QID) | ORAL | Status: DC | PRN

## 2014-06-20 MED ORDER — KETOROLAC TROMETHAMINE 30 MG/ML IJ SOLN
30.0000 mg | Freq: Once | INTRAMUSCULAR | Status: AC
  Administered 2014-06-20: 30 mg via INTRAVENOUS
  Filled 2014-06-20: qty 1

## 2014-06-20 MED ORDER — DIAZEPAM 5 MG PO TABS
5.0000 mg | ORAL_TABLET | Freq: Once | ORAL | Status: AC
  Administered 2014-06-20: 5 mg via ORAL
  Filled 2014-06-20: qty 1

## 2014-06-20 MED ORDER — METOCLOPRAMIDE HCL 5 MG/ML IJ SOLN
10.0000 mg | Freq: Once | INTRAMUSCULAR | Status: AC
  Administered 2014-06-20: 10 mg via INTRAVENOUS
  Filled 2014-06-20: qty 2

## 2014-06-20 MED ORDER — IOHEXOL 350 MG/ML SOLN
100.0000 mL | Freq: Once | INTRAVENOUS | Status: AC | PRN
  Administered 2014-06-20: 100 mL via INTRAVENOUS

## 2014-06-20 MED ORDER — SODIUM CHLORIDE 0.9 % IV BOLUS (SEPSIS)
500.0000 mL | Freq: Once | INTRAVENOUS | Status: AC
  Administered 2014-06-20: 500 mL via INTRAVENOUS

## 2014-06-20 MED ORDER — HYDROCODONE-ACETAMINOPHEN 5-325 MG PO TABS: 1.0000 | ORAL_TABLET | Freq: Four times a day (QID) | ORAL | Status: DC | PRN

## 2014-06-20 MED ORDER — LORAZEPAM 2 MG/ML IJ SOLN
1.0000 mg | Freq: Once | INTRAMUSCULAR | Status: DC
  Filled 2014-06-20: qty 1

## 2014-06-20 NOTE — ED Notes (Signed)
Family at bedside. Patient visibly upset. States that they have stuck her several times to start IV and that she still has to be stuck again to get blood. States that she did not take her anxiety meds last night. States that she can not take Ativan it makes her Hallucinate.

## 2014-06-20 NOTE — Discharge Instructions (Signed)
Epley Maneuver Self-Care WHAT IS THE EPLEY MANEUVER? The Epley maneuver is an exercise you can do to relieve symptoms of benign paroxysmal positional vertigo (BPPV). This condition is often just referred to as vertigo. BPPV is caused by the movement of tiny crystals (canaliths) inside your inner ear. The accumulation and movement of canaliths in your inner ear causes a sudden spinning sensation (vertigo) when you move your head to certain positions. Vertigo usually lasts about 30 seconds. BPPV usually occurs in just one ear. If you get vertigo when you lie on your left side, you probably have BPPV in your left ear. Your health care provider can tell you which ear is involved.  BPPV may be caused by a head injury. Many people older than 50 get BPPV for unknown reasons. If you have been diagnosed with BPPV, your health care provider may teach you how to do this maneuver. BPPV is not life threatening (benign) and usually goes away in time.  WHEN SHOULD I PERFORM THE EPLEY MANEUVER? You can do this maneuver at home whenever you have symptoms of vertigo. You may do the Epley maneuver up to 3 times a day until your symptoms of vertigo go away. HOW SHOULD I DO THE EPLEY MANEUVER?  Sit on the edge of a bed or table with your back straight. Your legs should be extended or hanging over the edge of the bed or table.   Turn your head halfway toward the affected ear.   Lie backward quickly with your head turned until you are lying flat on your back. You may want to position a pillow under your shoulders.   Hold this position for 30 seconds. You may experience an attack of vertigo. This is normal. Hold this position until the vertigo stops.  Then turn your head to the opposite direction until your unaffected ear is facing the floor.   Hold this position for 30 seconds. You may experience an attack of vertigo. This is normal. Hold this position until the vertigo stops.  Now turn your whole body to the  same side as your head. Hold for another 30 seconds.   You can then sit back up. ARE THERE RISKS TO THIS MANEUVER? In some cases, you may have other symptoms (such as changes in your vision, weakness, or numbness). If you have these symptoms, stop doing the maneuver and call your health care provider. Even if doing these maneuvers relieves your vertigo, you may still have dizziness. Dizziness is the sensation of light-headedness but without the sensation of movement. Even though the Epley maneuver may relieve your vertigo, it is possible that your symptoms will return within 5 years. WHAT SHOULD I DO AFTER THIS MANEUVER? After doing the Epley maneuver, you can return to your normal activities. Ask your doctor if there is anything you should do at home to prevent vertigo. This may include:  Sleeping with two or more pillows to keep your head elevated.  Not sleeping on the side of your affected ear.  Getting up slowly from bed.  Avoiding sudden movements during the day.  Avoiding extreme head movement, like looking up or bending over.  Wearing a cervical collar to prevent sudden head movements. WHAT SHOULD I DO IF MY SYMPTOMS GET WORSE? Call your health care provider if your vertigo gets worse. Call your provider right way if you have other symptoms, including:   Nausea.  Vomiting.  Headache.  Weakness.  Numbness.  Vision changes. Document Released: 02/27/2013 Document Reviewed: 02/27/2013 ExitCare  Patient Information 2015 [place], [place]. This information is not intended to replace advice given to you by your health care provider. Make sure you discuss any questions you have with your health care provider. Polycystic Ovarian Syndrome Polycystic ovarian syndrome (PCOS) is a common hormonal disorder among women of reproductive age. Most women with PCOS grow many small cysts on their ovaries. PCOS can cause problems with your periods and make it difficult to get pregnant. It can also  cause an increased risk of miscarriage with pregnancy. If left untreated, PCOS can lead to serious health problems, such as diabetes and heart disease. CAUSES The cause of PCOS is not fully understood, but genetics may be a factor. SIGNS AND SYMPTOMS   Infrequent or no menstrual periods.   Inability to get pregnant (infertility) because of not ovulating.   Increased growth of hair on the face, chest, stomach, back, thumbs, thighs, or toes.   Acne, oily skin, or dandruff.   Pelvic pain.   Weight gain or obesity, usually carrying extra weight around the waist.   Type 2 diabetes.   High cholesterol.   High blood pressure.   Female-pattern baldness or thinning hair.   Patches of thickened and dark brown or black skin on the neck, arms, breasts, or thighs.   Tiny excess flaps of skin (skin tags) in the armpits or neck area.   Excessive snoring and having breathing stop at times while asleep (sleep apnea).   Deepening of the voice.   Gestational diabetes when pregnant.  DIAGNOSIS  There is no single test to diagnose PCOS.   Your health care provider will:   Take a medical history.   Perform a pelvic exam.   Have ultrasonography done.   Check your female and female hormone levels.   Measure glucose or sugar levels in the blood.   Do other blood tests.   If you are producing too many female hormones, your health care provider will make sure it is from PCOS. At the physical exam, your health care provider will want to evaluate the areas of increased hair growth. Try to allow natural hair growth for a few days before the visit.   During a pelvic exam, the ovaries may be enlarged or swollen because of the increased number of small cysts. This can be seen more easily by using vaginal ultrasonography or screening to examine the ovaries and lining of the uterus (endometrium) for cysts. The uterine lining may become thicker if you have not been having a  regular period.  TREATMENT  Because there is no cure for PCOS, it needs to be managed to prevent problems. Treatments are based on your symptoms. Treatment is also based on whether you want to have a baby or whether you need contraception.  Treatment may include:   Progesterone hormone to start a menstrual period.   Birth control pills to make you have regular menstrual periods.   Medicines to make you ovulate, if you want to get pregnant.   Medicines to control your insulin.   Medicine to control your blood pressure.   Medicine and diet to control your high cholesterol and triglycerides in your blood.  Medicine to reduce excessive hair growth.  Surgery, making small holes in the ovary, to decrease the amount of female hormone production. This is done through a long, lighted tube (laparoscope) placed into the pelvis through a tiny incision in the lower abdomen.  HOME CARE INSTRUCTIONS  Only take over-the-counter or prescription medicine as directed by  your health care provider.  Pay attention to the foods you eat and your activity levels. This can help reduce the effects of PCOS.  Keep your weight under control.  Eat foods that are low in carbohydrate and high in fiber.  Exercise regularly. SEEK MEDICAL CARE IF:  Your symptoms do not get better with medicine.  You have new symptoms. Document Released: 06/18/2004 Document Revised: 12/13/2012 Document Reviewed: 08/10/2012 [facility] Patient Information 2015 [place], [place]. This information is not intended to replace advice given to you by your health care provider. Make sure you discuss any questions you have with your health care provider. Benign Positional Vertigo Vertigo means you feel like you or your surroundings are moving when they are not. Benign positional vertigo is the most common form of vertigo. Benign means that the cause of your condition is not serious. Benign positional vertigo is more common in older  adults. CAUSES  Benign positional vertigo is the result of an upset in the labyrinth system. This is an area in the middle ear that helps control your balance. This may be caused by a viral infection, head injury, or repetitive motion. However, often no specific cause is found. SYMPTOMS  Symptoms of benign positional vertigo occur when you move your head or eyes in different directions. Some of the symptoms may include:  Loss of balance and falls.  Vomiting.  Blurred vision.  Dizziness.  Nausea.  Involuntary eye movements (nystagmus). DIAGNOSIS  Benign positional vertigo is usually diagnosed by physical exam. If the specific cause of your benign positional vertigo is unknown, your caregiver may perform imaging tests, such as magnetic resonance imaging (MRI) or computed tomography (CT). TREATMENT  Your caregiver may recommend movements or procedures to correct the benign positional vertigo. Medicines such as meclizine, benzodiazepines, and medicines for nausea may be used to treat your symptoms. In rare cases, if your symptoms are caused by certain conditions that affect the inner ear, you may need surgery. HOME CARE INSTRUCTIONS   Follow your caregiver's instructions.  Move slowly. Do not make sudden body or head movements.  Avoid driving.  Avoid operating heavy machinery.  Avoid performing any tasks that would be dangerous to you or others during a vertigo episode.  Drink enough fluids to keep your urine clear or pale yellow. SEEK IMMEDIATE MEDICAL CARE IF:   You develop problems with walking, weakness, numbness, or using your arms, hands, or legs.  You have difficulty speaking.  You develop severe headaches.  Your nausea or vomiting continues or gets worse.  You develop visual changes.  Your family or friends notice any behavioral changes.  Your condition gets worse.  You have a fever.  You develop a stiff neck or sensitivity to light. MAKE SURE YOU:    Understand these instructions.  Will watch your condition.  Will get help right away if you are not doing well or get worse. Document Released: 11/30/2005 Document Revised: 05/17/2011 Document Reviewed: 11/12/2010 [facility]LPExit[facility] Patient Information 2015 [place], [place]. This information is not intended to replace advice given to you by your health care provider. Make sure you discuss any questions you have with your health care provider.

## 2014-06-28 ENCOUNTER — Emergency Department (HOSPITAL_COMMUNITY)
Admission: EM | Admit: 2014-06-28 | Discharge: 2014-06-29 | Disposition: A | Discharge status: Home / Self Care | Payer: Self-pay | Attending: Emergency Medicine | Admitting: Emergency Medicine

## 2014-06-28 ENCOUNTER — Encounter (HOSPITAL_COMMUNITY): Payer: Self-pay | Admitting: *Deleted

## 2014-06-28 DIAGNOSIS — E039 Hypothyroidism, unspecified: Secondary | ICD-10-CM | POA: Insufficient documentation

## 2014-06-28 DIAGNOSIS — Z79899 Other long term (current) drug therapy: Secondary | ICD-10-CM | POA: Insufficient documentation

## 2014-06-28 DIAGNOSIS — F410 Panic disorder [episodic paroxysmal anxiety] without agoraphobia: Secondary | ICD-10-CM | POA: Insufficient documentation

## 2014-06-28 DIAGNOSIS — F419 Anxiety disorder, unspecified: Secondary | ICD-10-CM | POA: Insufficient documentation

## 2014-06-28 DIAGNOSIS — R42 Dizziness and giddiness: Secondary | ICD-10-CM

## 2014-06-28 DIAGNOSIS — Z720 Tobacco use: Secondary | ICD-10-CM | POA: Insufficient documentation

## 2014-06-28 DIAGNOSIS — Z872 Personal history of diseases of the skin and subcutaneous tissue: Secondary | ICD-10-CM | POA: Insufficient documentation

## 2014-06-28 DIAGNOSIS — G8929 Other chronic pain: Secondary | ICD-10-CM | POA: Insufficient documentation

## 2014-06-28 LAB — I-STAT CHEM 8, ED
BUN: 5 mg/dL — ABNORMAL LOW (ref 6–23)
Calcium, Ion: 1.11 mmol/L — ABNORMAL LOW (ref 1.12–1.23)
Chloride: 104 mmol/L (ref 96–112)
Creatinine, Ser: 0.7 mg/dL (ref 0.50–1.10)
Glucose, Bld: 122 mg/dL — ABNORMAL HIGH (ref 70–99)
HEMATOCRIT: 45 % (ref 36.0–46.0)
HEMOGLOBIN: 15.3 g/dL — AB (ref 12.0–15.0)
POTASSIUM: 5.1 mmol/L (ref 3.5–5.1)
SODIUM: 140 mmol/L (ref 135–145)
TCO2: 24 mmol/L (ref 0–100)

## 2014-06-28 LAB — I-STAT BETA HCG BLOOD, ED (MC, WL, AP ONLY): I-stat hCG, quantitative: <5 m[IU]/mL (ref <5)

## 2014-06-28 MED ORDER — DIAZEPAM 5 MG/ML IJ SOLN
5.0000 mg | Freq: Once | INTRAMUSCULAR | Status: AC
  Administered 2014-06-28: 5 mg via INTRAVENOUS
  Filled 2014-06-28: qty 2

## 2014-06-28 MED ORDER — SODIUM CHLORIDE 0.9 % IV BOLUS (SEPSIS)
1000.0000 mL | Freq: Once | INTRAVENOUS | Status: AC
  Administered 2014-06-28: 1000 mL via INTRAVENOUS

## 2014-06-28 NOTE — ED Notes (Signed)
The pt is c/o a headache blurred vision with nausea for 1-2 months.  [facility]  She has a history of anxiety. lmp lastmonth

## 2014-06-28 NOTE — ED Notes (Signed)
Pt experiencing panic attack, HR shot up to 188, hyperventilating. Crying that shes tired of feeling sick all the time. EKG performed at this time. Pt states hx of panic attacks.

## 2014-06-29 LAB — D-DIMER, QUANTITATIVE (NOT AT ARMC): D DIMER QUANT: 0.42 ug{FEU}/mL (ref 0.00–0.48)

## 2014-06-29 NOTE — Discharge Instructions (Signed)
Dizziness   Dizziness means you feel unsteady or lightheaded. You might feel like you are going to pass out (faint).  HOME CARE   · Drink enough fluids to keep your pee (urine) clear or pale yellow.  · Take your medicines exactly as told by your doctor. If you take blood pressure medicine, always stand up slowly from the lying or sitting position. Hold on to something to steady yourself.  · If you need to stand in one place for a long time, move your legs often. Tighten and relax your leg muscles.  · Have someone stay with you until you feel okay.  · Do not drive or use heavy machinery if you feel dizzy.  · Do not drink alcohol.  GET HELP RIGHT AWAY IF:   · You feel dizzy or lightheaded and it gets worse.  · You feel sick to your stomach (nauseous), or you throw up (vomit).  · You have trouble talking or walking.  · You feel weak or have trouble using your arms, hands, or legs.  · You cannot think clearly or have trouble forming sentences.  · You have chest pain, belly (abdominal) pain, sweating, or you are short of breath.  · Your vision changes.  · You are bleeding.  · You have problems from your medicine that seem to be getting worse.  MAKE SURE YOU:   · Understand these instructions.  · Will watch your condition.  · Will get help right away if you are not doing well or get worse.  Document Released: 02/11/2011 Document Revised: 05/17/2011 Document Reviewed: 02/11/2011  ExitCare® Patient Information ©2015 [employer]. This information is not intended to replace advice given to you by your health care provider. Make sure you discuss any questions you have with your health care provider.

## 2014-06-29 NOTE — ED Provider Notes (Signed)
CSN: [id number]     Arrival date & time 06/28/14  2039 History   First MD Initiated Contact with Patient 06/28/14 2248     Chief Complaint  Patient presents with  . Headache     (Consider location/radiation/quality/duration/timing/severity/associated sxs/prior Treatment) HPI 29 year old female presents today complaining of dizziness. She states this occurred over the past year but then worsened over the past month. She describes it as being swimmy headed. It is worse with laying down and went occur she was to get up and move around. She has been seen multiple times for similar complaints in the past several months. Should she was seen several days ago [facility]. She tells me she has not been taking her medications except for her thyroid medicine. She states her last thyroid screen were 6 weeks ago and she reports as normal.  She denies any change in headaches, neck swelling, lateralized weakness, or vision changes. Medication list lists Xanax, hydrocodone, and Ativan but she denies that she is taking any of these. She has irregular periods and states that she has polycystic ovarian disease is not sure when she had her last period Past Medical History  Diagnosis Date  . Hydradenitis   . Polycystic ovarian disease   . Depression   . Chronic back pain   . Hypothyroid   . Panic attack    Past Surgical History  Procedure Laterality Date  . Cholecystectomy    . Dilation and curettage of uterus     No family history on file. History  Substance Use Topics  . Smoking status: Current Some Day Smoker -- 0.25 packs/day for 9 years    Types: Cigarettes    Last Attempt to Quit: 03/08/2012  . Smokeless tobacco: Never Used  . Alcohol Use: Yes     Comment: rarely   OB History    Gravida Para Term Preterm AB TAB SAB Ectopic Multiple Living   Review of Systems  All other systems reviewed and are negative.     Allergies  Tramadol; Ativan; and Banana  Home Medications    Prior to Admission medications   Medication Sig Start Date End Date Taking? Authorizing Provider  acetaminophen (TYLENOL) 500 MG tablet Take 1,000 mg by mouth every 6 (six) hours as needed for mild pain or headache.   Yes Historical Provider, MD  ALPRAZolam (XANAX) 0.25 MG tablet Take 1 tablet (0.25 mg total) by mouth 2 (two) times daily as needed for anxiety. 06/05/14  Yes Burgess Amor, PA-C  butalbital-acetaminophen-caffeine (FIORICET) 218 114 2935 MG per tablet Take 1 tablet by mouth every 6 (six) hours as needed for headache. 06/05/14 06/05/15 Yes Raynelle Fanning Idol, PA-C  CRANBERRY PO Take 2 capsules by mouth daily.   Yes Historical Provider, MD  diphenhydrAMINE (BENADRYL) 25 mg capsule Take 25 mg by mouth every 6 (six) hours as needed for itching or allergies.   Yes Historical Provider, MD  HYDROcodone-acetaminophen (NORCO/VICODIN) 5-325 MG per tablet Take 1 tablet by mouth every 6 (six) hours as needed. 06/20/14  Yes [name], MD  ibuprofen (ADVIL,MOTRIN) 600 MG tablet Take 1 tablet (600 mg total) by mouth every 6 (six) hours as needed. 06/20/14  Yes [name], MD  levothyroxine (SYNTHROID, LEVOTHROID) 75 MCG tablet Take 75 mcg by mouth daily before breakfast.   Yes Historical Provider, MD  loratadine (CLARITIN) 10 MG tablet Take 10 mg by mouth daily as needed for allergies.   Yes  Historical Provider, MD  meclizine (ANTIVERT) 32 MG tablet Take 1 tablet (32 mg total) by mouth 3 (three) times daily as needed. Patient taking differently: Take 32 mg by mouth 3 (three) times daily as needed for dizziness or nausea.  06/20/14  Yes Ankit Rhunette CroftNanavati, MD  LORazepam (ATIVAN) 1 MG tablet Take 1 tablet (1 mg total) by mouth 3 (three) times daily as needed for anxiety. Patient not taking: Reported on 06/05/2014 05/08/14   Paula LibraJohn Molpus, MD  Multiple Vitamin (MULTIVITAMIN WITH MINERALS) TABS tablet Take 1 tablet by mouth daily.    Historical Provider, MD  potassium chloride 20 MEQ TBCR Take 10 mEq by mouth  daily. Patient not taking: Reported on 06/05/2014 05/07/14   Kristen N Ward, DO   BP 144/91 mmHg  Pulse 101  Temp(Src) 98.6 F (37 C) (Oral)  Resp 19  Ht 5\' 3"  (1.6 m)  Wt 320 lb (145.151 kg)  BMI 56.70 kg/m2  SpO2 100%  LMP 06/12/2014 Physical Exam  Constitutional: She is oriented to person, place, and time. She appears well-developed and well-nourished.  Morbidly obese  HENT:  Head: Normocephalic and atraumatic.  Right Ear: Tympanic membrane and external ear normal.  Left Ear: Tympanic membrane and external ear normal.  Nose: Nose normal. Right sinus exhibits no maxillary sinus tenderness and no frontal sinus tenderness. Left sinus exhibits no maxillary sinus tenderness and no frontal sinus tenderness.  Eyes: Conjunctivae and EOM are normal. Pupils are equal, round, and reactive to light. Right eye exhibits no nystagmus. Left eye exhibits no nystagmus.  Neck: Normal range of motion. Neck supple.  Cardiovascular: Normal rate, regular rhythm, normal heart sounds and intact distal pulses.   Pulmonary/Chest: Effort normal and breath sounds normal. No respiratory distress. She exhibits no tenderness.  Abdominal: Soft. Bowel sounds are normal. She exhibits no distension and no mass. There is no tenderness.  Musculoskeletal: Normal range of motion. She exhibits no edema or tenderness.  Neurological: She is alert and oriented to person, place, and time. She has normal strength and normal reflexes. No sensory deficit. She displays a negative Romberg sign. GCS eye subscore is 4. GCS verbal subscore is 5. GCS motor subscore is 6.  Reflex Scores:      Tricep reflexes are 2+ on the right side and 2+ on the left side.      Bicep reflexes are 2+ on the right side and 2+ on the left side.      Brachioradialis reflexes are 2+ on the right side and 2+ on the left side.      Patellar reflexes are 2+ on the right side and 2+ on the left side.      Achilles reflexes are 2+ on the right side and 2+ on the  left side. Patient with normal gait without ataxia, shuffling, spasm, or antalgia. Speech is normal without dysarthria, dysphasia, or aphasia. Muscle strength is 5/5 in bilateral shoulders, elbow flexor and extensors, wrist flexor and extensors, and intrinsic hand muscles. 5/5 bilateral lower extremity hip flexors, extensors, knee flexors and extensors, and ankle dorsi and plantar flexors.    Skin: Skin is warm and dry. No rash noted.  Psychiatric: Her behavior is normal. Judgment and thought content normal. Her mood appears anxious.  Nursing note and vitals reviewed.   ED Course  Procedures (including critical care time) Labs Review Labs Reviewed  I-STAT CHEM 8, ED - Abnormal; Notable for the following:    BUN 5 (*)    Glucose, Bld 122 (*)  Calcium, Ion 1.11 (*)    Hemoglobin 15.3 (*)    All other components within normal limits  D-DIMER, QUANTITATIVE  I-STAT BETA HCG BLOOD, ED ([facility], [facility], [facility] ONLY)    Imaging Review No results found.   EKG Interpretation   Date/Time:  Friday June 28 2014 23:11:11 EDT Ventricular Rate:  136 PR Interval:  131 QRS Duration: 80 QT Interval:  305 QTC Calculation: 459 R Axis:   49 Text Interpretation:  Sinus tachycardia Confirmed by Jina Olenick MD, Duwayne Heck  ([id number]) on 06/29/2014 12:02:49 AM      MDM   Final diagnoses:  Dizzy  Anxiety    29 year old female who became very anxious here. She received Valium and began having a more normal respiratory status. During the episode of agitation and anxiety her heart rate went up to 150. EKG obtained shows a sinus tachycardia. Patient received 5 mg of Valium and heart rate normalized. Patient advised to follow-up with primary care physician and is given referral to neurology. I do not see any signs of focal neurological deficit. She is not currently having any headaches.  12:17 AM Patient states she began having headache after given anti anxitey medicine.  Patient advised she is being discharged and may  take her usual home meds.  Margarita Grizzle, MD 06/29/14 [phone]

## 2014-07-16 ENCOUNTER — Encounter (HOSPITAL_COMMUNITY): Payer: Self-pay | Admitting: Emergency Medicine

## 2014-07-16 ENCOUNTER — Emergency Department (HOSPITAL_COMMUNITY)
Admission: EM | Admit: 2014-07-16 | Discharge: 2014-07-16 | Discharge status: Against Medical Advice | Payer: Self-pay | Attending: Emergency Medicine | Admitting: Emergency Medicine

## 2014-07-16 DIAGNOSIS — F410 Panic disorder [episodic paroxysmal anxiety] without agoraphobia: Secondary | ICD-10-CM | POA: Insufficient documentation

## 2014-07-16 DIAGNOSIS — R51 Headache: Secondary | ICD-10-CM | POA: Insufficient documentation

## 2014-07-16 DIAGNOSIS — I10 Essential (primary) hypertension: Secondary | ICD-10-CM | POA: Insufficient documentation

## 2014-07-16 DIAGNOSIS — G8929 Other chronic pain: Secondary | ICD-10-CM | POA: Insufficient documentation

## 2014-07-16 DIAGNOSIS — Z720 Tobacco use: Secondary | ICD-10-CM | POA: Insufficient documentation

## 2014-07-16 NOTE — ED Notes (Signed)
Pt c/o headache and frequent panic attacks.

## 2014-07-17 DIAGNOSIS — F329 Major depressive disorder, single episode, unspecified: Secondary | ICD-10-CM | POA: Insufficient documentation

## 2014-07-17 DIAGNOSIS — I10 Essential (primary) hypertension: Secondary | ICD-10-CM | POA: Insufficient documentation

## 2014-07-17 DIAGNOSIS — F410 Panic disorder [episodic paroxysmal anxiety] without agoraphobia: Secondary | ICD-10-CM | POA: Insufficient documentation

## 2014-07-17 DIAGNOSIS — E039 Hypothyroidism, unspecified: Secondary | ICD-10-CM | POA: Insufficient documentation

## 2014-07-17 DIAGNOSIS — G8929 Other chronic pain: Secondary | ICD-10-CM | POA: Insufficient documentation

## 2014-07-17 DIAGNOSIS — Z872 Personal history of diseases of the skin and subcutaneous tissue: Secondary | ICD-10-CM | POA: Insufficient documentation

## 2014-07-17 DIAGNOSIS — Z720 Tobacco use: Secondary | ICD-10-CM | POA: Insufficient documentation

## 2014-07-17 DIAGNOSIS — Z79899 Other long term (current) drug therapy: Secondary | ICD-10-CM | POA: Insufficient documentation

## 2014-07-18 ENCOUNTER — Encounter (HOSPITAL_COMMUNITY): Payer: Self-pay | Admitting: Emergency Medicine

## 2014-07-18 ENCOUNTER — Emergency Department (HOSPITAL_COMMUNITY)
Admission: EM | Admit: 2014-07-18 | Discharge: 2014-07-18 | Disposition: A | Discharge status: Home / Self Care | Payer: Self-pay | Attending: Emergency Medicine | Admitting: Emergency Medicine

## 2014-07-18 ENCOUNTER — Emergency Department (HOSPITAL_COMMUNITY): Payer: Self-pay

## 2014-07-18 DIAGNOSIS — F410 Panic disorder [episodic paroxysmal anxiety] without agoraphobia: Secondary | ICD-10-CM

## 2014-07-18 LAB — BASIC METABOLIC PANEL
ANION GAP: 6 (ref 5–15)
BUN: 9 mg/dL (ref 6–20)
CO2: 26 mmol/L (ref 22–32)
CREATININE: 0.66 mg/dL (ref 0.44–1.00)
Calcium: 8.6 mg/dL — ABNORMAL LOW (ref 8.9–10.3)
Chloride: 107 mmol/L (ref 101–111)
GFR calc Af Amer: >60 mL/min (ref >60)
GLUCOSE: 115 mg/dL — AB (ref 65–99)
Potassium: 3.5 mmol/L (ref 3.5–5.1)
Sodium: 139 mmol/L (ref 135–145)

## 2014-07-18 LAB — CBC WITH DIFFERENTIAL/PLATELET
Basophils Absolute: 0 10*3/uL (ref 0.0–0.1)
Basophils Relative: 0 % (ref 0–1)
EOS PCT: 1 % (ref 0–5)
Eosinophils Absolute: 0.1 10*3/uL (ref 0.0–0.7)
HEMATOCRIT: 41.3 % (ref 36.0–46.0)
HEMOGLOBIN: 13.6 g/dL (ref 12.0–15.0)
Lymphocytes Relative: 24 % (ref 12–46)
Lymphs Abs: 2.4 10*3/uL (ref 0.7–4.0)
MCH: 28.8 pg (ref 26.0–34.0)
MCHC: 32.9 g/dL (ref 30.0–36.0)
MCV: 87.5 fL (ref 78.0–100.0)
Monocytes Absolute: 0.6 10*3/uL (ref 0.1–1.0)
Monocytes Relative: 6 % (ref 3–12)
NEUTROS PCT: 69 % (ref 43–77)
Neutro Abs: 6.9 10*3/uL (ref 1.7–7.7)
Platelets: 294 10*3/uL (ref 150–400)
RBC: 4.72 MIL/uL (ref 3.87–5.11)
RDW: 13.2 % (ref 11.5–15.5)
WBC: 10 10*3/uL (ref 4.0–10.5)

## 2014-07-18 LAB — TROPONIN I: Troponin I: <0.03 ng/mL (ref <0.031)

## 2014-07-18 MED ORDER — METOPROLOL TARTRATE 50 MG PO TABS
50.0000 mg | ORAL_TABLET | Freq: Once | ORAL | Status: AC
  Administered 2014-07-18: 50 mg via ORAL
  Filled 2014-07-18: qty 1

## 2014-07-18 MED ORDER — DIAZEPAM 5 MG PO TABS
5.0000 mg | ORAL_TABLET | Freq: Once | ORAL | Status: AC
  Administered 2014-07-18: 5 mg via ORAL
  Filled 2014-07-18: qty 1

## 2014-07-18 MED ORDER — ALPRAZOLAM 0.5 MG PO TABS: 0.5000 mg | ORAL_TABLET | Freq: Three times a day (TID) | ORAL | Status: DC | PRN

## 2014-07-18 MED ORDER — METOPROLOL TARTRATE 25 MG PO TABS: 25.0000 mg | ORAL_TABLET | Freq: Two times a day (BID) | ORAL | Status: DC

## 2014-07-18 MED ORDER — HYDROXYZINE HCL 10 MG PO TABS: 10.0000 mg | ORAL_TABLET | Freq: Three times a day (TID) | ORAL | Status: DC | PRN

## 2014-07-18 NOTE — ED Provider Notes (Signed)
CSN: [id number]     Arrival date & time 07/17/14  2357 History   First MD Initiated Contact with Patient 07/18/14 0146     Chief Complaint  Patient presents with  . Chest Pain      HPI  Patient ridge evaluation of the panic attack. Multiple episodes over the last several months. Presents hyperventilating. Our nursing staff did an excellent job and were able to speak with her and talk her through some of this she is improving markedly as I examine her in the room. She states she feels "like him on fire". Heart rate is improving. She is calm and breathing slower. States when she gets like this it makes her feel quite anxious and painful all over. She is seeing a primary care physician that she is just gotten in touch with. Has a psychiatrist appointment on Monday.  Past Medical History  Diagnosis Date  . Hydradenitis   . Polycystic ovarian disease   . Depression   . Chronic back pain   . Hypothyroid   . Panic attack   . Hypertension    Past Surgical History  Procedure Laterality Date  . Cholecystectomy    . Dilation and curettage of uterus     History reviewed. No pertinent family history. History  Substance Use Topics  . Smoking status: Current Some Day Smoker -- 0.25 packs/day for 9 years    Types: Cigarettes    Last Attempt to Quit: 03/08/2012  . Smokeless tobacco: Never Used  . Alcohol Use: Yes     Comment: rarely   OB History    Gravida Para Term Preterm AB TAB SAB Ectopic Multiple Living   3    3  3         Review of Systems  Constitutional: Negative for fever, chills, diaphoresis, appetite change and fatigue.  HENT: Negative for mouth sores, sore throat and trouble swallowing.   Eyes: Negative for visual disturbance.  Respiratory: Negative for cough, chest tightness, shortness of breath and wheezing.   Cardiovascular: Positive for chest pain.  Gastrointestinal: Negative for nausea, vomiting, abdominal pain, diarrhea and abdominal distention.  Endocrine: Negative  for polydipsia, polyphagia and polyuria.  Genitourinary: Negative for dysuria, frequency and hematuria.  Musculoskeletal: Negative for gait problem.  Skin: Negative for color change, pallor and rash.  Neurological: Negative for dizziness, syncope, light-headedness and headaches.  Hematological: Does not bruise/bleed easily.  Psychiatric/Behavioral: Negative for behavioral problems and confusion. The patient is nervous/anxious.       Allergies  Tramadol; Ativan; and Banana  Home Medications   Prior to Admission medications   Medication Sig Start Date End Date Taking? Authorizing Provider  acetaminophen (TYLENOL) 500 MG tablet Take 1,000 mg by mouth every 6 (six) hours as needed for mild pain or headache.    Historical Provider, MD  ALPRAZolam Prudy Feeler(XANAX) 0.5 MG tablet Take 1 tablet (0.5 mg total) by mouth 3 (three) times daily as needed for anxiety. 07/18/14   Rolland PorterMark Vanden Fawaz, MD  butalbital-acetaminophen-caffeine (FIORICET) [phone] MG per tablet Take 1 tablet by mouth every 6 (six) hours as needed for headache. 06/05/14 06/05/15  Burgess AmorJulie Idol, PA-C  CRANBERRY PO Take 2 capsules by mouth daily.    Historical Provider, MD  diphenhydrAMINE (BENADRYL) 25 mg capsule Take 25 mg by mouth every 6 (six) hours as needed for itching or allergies.    Historical Provider, MD  HYDROcodone-acetaminophen (NORCO/VICODIN) 5-325 MG per tablet Take 1 tablet by mouth every 6 (six) hours as needed. 06/20/14  Derwood KaplanAnkit Nanavati, MD  hydrOXYzine (ATARAX/VISTARIL) 10 MG tablet Take 1 tablet (10 mg total) by mouth 3 (three) times daily as needed. 07/18/14   Rolland PorterMark Keldric Poyer, MD  ibuprofen (ADVIL,MOTRIN) 600 MG tablet Take 1 tablet (600 mg total) by mouth every 6 (six) hours as needed. 06/20/14   Derwood KaplanAnkit Nanavati, MD  levothyroxine (SYNTHROID, LEVOTHROID) 75 MCG tablet Take 75 mcg by mouth daily before breakfast.    Historical Provider, MD  loratadine (CLARITIN) 10 MG tablet Take 10 mg by mouth daily as needed for allergies.    Historical  Provider, MD  LORazepam (ATIVAN) 1 MG tablet Take 1 tablet (1 mg total) by mouth 3 (three) times daily as needed for anxiety. Patient not taking: Reported on 06/05/2014 05/08/14   Paula LibraJohn Molpus, MD  meclizine (ANTIVERT) 32 MG tablet Take 1 tablet (32 mg total) by mouth 3 (three) times daily as needed. Patient taking differently: Take 32 mg by mouth 3 (three) times daily as needed for dizziness or nausea.  06/20/14   Derwood KaplanAnkit Nanavati, MD  metoprolol tartrate (LOPRESSOR) 25 MG tablet Take 1 tablet (25 mg total) by mouth 2 (two) times daily. 07/18/14   Rolland PorterMark Orean Giarratano, MD  Multiple Vitamin (MULTIVITAMIN WITH MINERALS) TABS tablet Take 1 tablet by mouth daily.    Historical Provider, MD  potassium chloride 20 MEQ TBCR Take 10 mEq by mouth daily. Patient not taking: Reported on 06/05/2014 05/07/14   Kristen N Ward, DO   BP 151/98 mmHg  Pulse 125  Temp(Src) 98.3 F (36.8 C)  Resp 18  Ht 5' (1.524 m)  Wt 330 lb (149.687 kg)  BMI 64.45 kg/m2  SpO2 100%  LMP 06/12/2014 Physical Exam  Constitutional: She is oriented to person, place, and time. She appears well-developed and well-nourished. No distress.  HENT:  Head: Normocephalic.  Eyes: Conjunctivae are normal. Pupils are equal, round, and reactive to light. No scleral icterus.  Neck: Normal range of motion. Neck supple. No thyromegaly present.  Cardiovascular: Normal rate and regular rhythm.  Exam reveals no gallop and no friction rub.   No murmur heard. Pulmonary/Chest: Effort normal and breath sounds normal. No respiratory distress. She has no wheezes. She has no rales.  Abdominal: Soft. Bowel sounds are normal. She exhibits no distension. There is no tenderness. There is no rebound.  Musculoskeletal: Normal range of motion.  Neurological: She is alert and oriented to person, place, and time.  Skin: Skin is warm and dry. No rash noted.  Psychiatric: Her behavior is normal. Her mood appears anxious. Her speech is rapid and/or pressured.    ED Course   Procedures (including critical care time) Labs Review Labs Reviewed  BASIC METABOLIC PANEL - Abnormal; Notable for the following:    Glucose, Bld 115 (*)    Calcium 8.6 (*)    All other components within normal limits  CBC WITH DIFFERENTIAL/PLATELET  TROPONIN I    Imaging Review Dg Chest 2 View  07/18/2014   CLINICAL DATA:  Substernal chest pain and dyspnea.  EXAM: CHEST  2 VIEW  COMPARISON:  06/05/2014  FINDINGS: The heart size and mediastinal contours are within normal limits. Both lungs are clear. The visualized skeletal structures are unremarkable.  IMPRESSION: No active cardiopulmonary disease.   Electronically Signed   By: Ellery Plunkaniel R Mitchell M.D.   On: 07/18/2014 00:33     EKG Interpretation   Date/Time:  Thursday Jul 18 2014 00:02:26 EDT Ventricular Rate:  133 PR Interval:  128 QRS Duration: 84 QT Interval:  314  QTC Calculation: 467 R Axis:   37 Text Interpretation:  Sinus tachycardia Otherwise normal ECG Confirmed by  Fayrene Fearing  MD, Imraan Wendell ([id number]) on 07/18/2014 1:47:06 AM      MDM   Final diagnoses:  Panic attack     Patient is more calm. Heart rate improves to 100. Normal exam. No additional hyperventilation or carpal spasm. I think a good plan for her would be to start her on a beta blocker rather than her lisinopril for her hypertension. This may help control her heart rate as well. Atarax as a nonaddictive anxiety medication. Limited number of Xanax to help get her through until her psychiatrist appointment on Monday, 4 days from now.    Rolland Porter, MD 07/18/14 [phone]

## 2014-07-18 NOTE — Discharge Instructions (Signed)
Stop lisinopril. Start new prescription for Lopressor. It can help control blood pressure, as well as your heart rate and anxiety. Exercise daily. Avoid extra salt. Control your weight. Atarax 3 times per day to help prevent anxiety. Xanax only as needed for panic attacks.   Panic Attacks Panic attacks are sudden, short-livedsurges of severe anxiety, fear, or discomfort. They may occur for no reason when you are relaxed, when you are anxious, or when you are sleeping. Panic attacks may occur for a number of reasons:   Healthy people occasionally have panic attacks in extreme, life-threatening situations, such as war or natural disasters. Normal anxiety is a protective mechanism of the body that helps us react to danger (fight or flight response).  Panic attacks are often seen with anxiety disorders, such as panic disorder, social anxiety disorder, generalized anxiety disorder, and phobias. Anxiety disorders cause excessive or uncontrollable anxiety. They may interfere with your relationships or other life activities.  Panic attacks are sometimes seen with other mental illnesses, such as depression and posttraumatic stress disorder.  Certain medical conditions, prescription medicines, and drugs of abuse can cause panic attacks. SYMPTOMS  Panic attacks start suddenly, peak within 20 minutes, and are accompanied by four or more of the following symptoms:  Pounding heart or fast heart rate (palpitations).  Sweating.  Trembling or shaking.  Shortness of breath or feeling smothered.  Feeling choked.  Chest pain or discomfort.  Nausea or strange feeling in your stomach.  Dizziness, light-headedness, or feeling like you will faint.  Chills or hot flushes.  Numbness or tingling in your lips or hands and feet.  Feeling that things are not real or feeling that you are not yourself.  Fear of losing control or going crazy.  Fear of dying. Some of these symptoms can mimic serious  medical conditions. For example, you may think you are having a heart attack. Although panic attacks can be very scary, they are not life threatening. DIAGNOSIS  Panic attacks are diagnosed through an assessment by your health care provider. Your health care provider will ask questions about your symptoms, such as where and when they occurred. Your health care provider will also ask about your medical history and use of alcohol and drugs, including prescription medicines. Your health care provider may order blood tests or other studies to rule out a serious medical condition. Your health care provider may refer you to a mental health professional for further evaluation. TREATMENT   Most healthy people who have one or two panic attacks in an extreme, life-threatening situation will not require treatment.  The treatment for panic attacks associated with anxiety disorders or other mental illness typically involves counseling with a mental health professional, medicine, or a combination of both. Your health care provider will help determine what treatment is best for you.  Panic attacks due to physical illness usually go away with treatment of the illness. If prescription medicine is causing panic attacks, talk with your health care provider about stopping the medicine, decreasing the dose, or substituting another medicine.  Panic attacks due to alcohol or drug abuse go away with abstinence. Some adults need professional help in order to stop drinking or using drugs. HOME CARE INSTRUCTIONS   Take all medicines as directed by your health care provider.   Schedule and attend follow-up visits as directed by your health care provider. It is important to keep all your appointments. SEEK MEDICAL CARE IF:  You are not able to take your medicines as prescribed.  Your symptoms do not improve or get worse. SEEK IMMEDIATE MEDICAL CARE IF:   You experience panic attack symptoms that are different than your  usual symptoms.  You have serious thoughts about hurting yourself or others.  You are taking medicine for panic attacks and have a serious side effect. MAKE SURE YOU:  Understand these instructions.  Will watch your condition.  Will get help right away if you are not doing well or get worse. Document Released: 02/22/2005 Document Revised: 02/27/2013 Document Reviewed: 10/06/2012 [facility] Patient Information 2015 [employer], [place]. This information is not intended to replace advice given to you by your health care provider. Make sure you discuss any questions you have with your health care provider.

## 2014-07-18 NOTE — ED Notes (Signed)
Pt c/o chest pain and came into triage hyperventilating.

## 2014-07-23 ENCOUNTER — Encounter: Payer: Self-pay | Admitting: Cardiology

## 2014-07-23 NOTE — Progress Notes (Signed)
No show  This encounter was created in error - please disregard.

## 2014-08-04 ENCOUNTER — Emergency Department (HOSPITAL_COMMUNITY)
Admission: EM | Admit: 2014-08-04 | Discharge: 2014-08-04 | Disposition: A | Discharge status: Home / Self Care | Payer: Self-pay | Attending: Emergency Medicine | Admitting: Emergency Medicine

## 2014-08-04 ENCOUNTER — Encounter (HOSPITAL_COMMUNITY): Payer: Self-pay | Admitting: *Deleted

## 2014-08-04 DIAGNOSIS — Z8639 Personal history of other endocrine, nutritional and metabolic disease: Secondary | ICD-10-CM | POA: Insufficient documentation

## 2014-08-04 DIAGNOSIS — F410 Panic disorder [episodic paroxysmal anxiety] without agoraphobia: Secondary | ICD-10-CM | POA: Insufficient documentation

## 2014-08-04 DIAGNOSIS — F329 Major depressive disorder, single episode, unspecified: Secondary | ICD-10-CM | POA: Insufficient documentation

## 2014-08-04 DIAGNOSIS — M545 Low back pain: Secondary | ICD-10-CM | POA: Insufficient documentation

## 2014-08-04 DIAGNOSIS — M546 Pain in thoracic spine: Secondary | ICD-10-CM | POA: Insufficient documentation

## 2014-08-04 DIAGNOSIS — Z79899 Other long term (current) drug therapy: Secondary | ICD-10-CM | POA: Insufficient documentation

## 2014-08-04 DIAGNOSIS — Z3202 Encounter for pregnancy test, result negative: Secondary | ICD-10-CM | POA: Insufficient documentation

## 2014-08-04 DIAGNOSIS — Z720 Tobacco use: Secondary | ICD-10-CM | POA: Insufficient documentation

## 2014-08-04 DIAGNOSIS — G8929 Other chronic pain: Secondary | ICD-10-CM | POA: Insufficient documentation

## 2014-08-04 DIAGNOSIS — Z872 Personal history of diseases of the skin and subcutaneous tissue: Secondary | ICD-10-CM | POA: Insufficient documentation

## 2014-08-04 DIAGNOSIS — E039 Hypothyroidism, unspecified: Secondary | ICD-10-CM | POA: Insufficient documentation

## 2014-08-04 DIAGNOSIS — I10 Essential (primary) hypertension: Secondary | ICD-10-CM | POA: Insufficient documentation

## 2014-08-04 LAB — URINALYSIS, ROUTINE W REFLEX MICROSCOPIC
Bilirubin Urine: NEGATIVE
Glucose, UA: NEGATIVE mg/dL
HGB URINE DIPSTICK: NEGATIVE
Ketones, ur: NEGATIVE mg/dL
Leukocytes, UA: NEGATIVE
Nitrite: NEGATIVE
PH: 7 (ref 5.0–8.0)
PROTEIN: NEGATIVE mg/dL
Urobilinogen, UA: 0.2 mg/dL (ref 0.0–1.0)

## 2014-08-04 LAB — PREGNANCY, URINE: Preg Test, Ur: NEGATIVE

## 2014-08-04 MED ORDER — METOPROLOL TARTRATE 25 MG PO TABS: 25.0000 mg | ORAL_TABLET | Freq: Two times a day (BID) | ORAL | Status: DC | PRN

## 2014-08-04 MED ORDER — IBUPROFEN 800 MG PO TABS
800.0000 mg | ORAL_TABLET | Freq: Once | ORAL | Status: AC
  Administered 2014-08-04: 800 mg via ORAL
  Filled 2014-08-04: qty 1

## 2014-08-04 NOTE — Discharge Instructions (Signed)
Back Injury Prevention °Back injuries can be extremely painful and difficult to heal. After having one back injury, you are much more likely to experience another later on. It is important to learn how to avoid injuring or re-injuring your back. The following tips can help you to prevent a back injury. °PHYSICAL FITNESS °· Exercise regularly and try to develop good tone in your abdominal muscles. Your abdominal muscles provide a lot of the support needed by your back. °· Do aerobic exercises (walking, jogging, biking, swimming) regularly. °· Do exercises that increase balance and strength (tai chi, yoga) regularly. This can decrease your risk of falling and injuring your back. °· Stretch before and after exercising. °· Maintain a healthy weight. The more you weigh, the more stress is placed on your back. For every pound of weight, 10 times that amount of pressure is placed on the back. °DIET °· Talk to your caregiver about how much calcium and vitamin D you need per day. These nutrients help to prevent weakening of the bones (osteoporosis). Osteoporosis can cause broken (fractured) bones that lead to back pain. °· Include good sources of calcium in your diet, such as dairy products, green, leafy vegetables, and products with calcium added (fortified). °· Include good sources of vitamin D in your diet, such as milk and foods that are fortified with vitamin D. °· Consider taking a nutritional supplement or a multivitamin if needed. °· Stop smoking if you smoke. °POSTURE °· Sit and stand up straight. Avoid leaning forward when you sit or hunching over when you stand. °· Choose chairs with good low back (lumbar) support. °· If you work at a desk, sit close to your work so you do not need to lean over. Keep your chin tucked in. Keep your neck drawn back and elbows bent at a right angle. Your arms should look like the letter "L." °· Sit high and close to the steering wheel when you drive. Add a lumbar support to your car  seat if needed. °· Avoid sitting or standing in one position for too long. Take breaks to get up, stretch, and walk around at least once every hour. Take breaks if you are driving for long periods of time. °· Sleep on your side with your knees slightly bent, or sleep on your back with a pillow under your knees. Do not sleep on your stomach. °LIFTING, TWISTING, AND REACHING °· Avoid heavy lifting, especially repetitive lifting. If you must do heavy lifting: °· Stretch before lifting. °· Work slowly. °· Rest between lifts. °· Use carts and dollies to move objects when possible. °· Make several small trips instead of carrying 1 heavy load. °· Ask for help when you need it. °· Ask for help when moving big, awkward objects. °· Follow these steps when lifting: °· Stand with your feet shoulder-width apart. °· Get as close to the object as you can. Do not try to pick up heavy objects that are far from your body. °· Use handles or lifting straps if they are available. °· Bend at your knees. Squat down, but keep your heels off the floor. °· Keep your shoulders pulled back, your chin tucked in, and your back straight. °· Lift the object slowly, tightening the muscles in your legs, abdomen, and buttocks. Keep the object as close to the center of your body as possible. °· When you put a load down, use these same guidelines in reverse. °· Do not: °· Lift the object above your waist. °·   Twist at the waist while lifting or carrying a load. Move your feet if you need to turn, not your waist. °¨ Bend over without bending at your knees. °· Avoid reaching over your head, across a table, or for an object on a high surface. °OTHER TIPS °· Avoid wet floors and keep sidewalks clear of ice to prevent falls. °· Do not sleep on a mattress that is too soft or too hard. °· Keep items that are used frequently within easy reach. °· Put heavier objects on shelves at waist level and lighter objects on lower or higher shelves. °· Find ways to  decrease your stress, such as exercise, massage, or relaxation techniques. Stress can build up in your muscles. Tense muscles are more vulnerable to injury. °· Seek treatment for depression or anxiety if needed. These conditions can increase your risk of developing back pain. °SEEK MEDICAL CARE IF: °· You injure your back. °· You have questions about diet, exercise, or other ways to prevent back injuries. °MAKE SURE YOU: °· Understand these instructions. °· Will watch your condition. °· Will get help right away if you are not doing well or get worse. °Document Released: 04/01/2004 Document Revised: 05/17/2011 Document Reviewed: 04/05/2011 °[employer]® Patient Information ©2015 [employer]. This information is not intended to replace advice given to you by your health care provider. Make sure you discuss any questions you have with your health care provider. ° °Back Exercises °Back exercises help treat and prevent back injuries. The goal is to increase your strength in your belly (abdominal) and back muscles. These exercises can also help with flexibility. Start these exercises when told by your doctor. °HOME CARE °Back exercises include: °Pelvic Tilt. °· Lie on your back with your knees bent. Tilt your pelvis until the lower part of your back is against the floor. Hold this position 5 to 10 sec. Repeat this exercise 5 to 10 times. °Knee to Chest. °· Pull 1 knee up against your chest and hold for 20 to 30 seconds. Repeat this with the other knee. This may be done with the other leg straight or bent, whichever feels better. Then, pull both knees up against your chest. °Sit-Ups or Curl-Ups. °· Bend your knees 90 degrees. Start with tilting your pelvis, and do a partial, slow sit-up. Only lift your upper half 30 to 45 degrees off the floor. Take at least 2 to 3 seonds for each sit-up. Do not do sit-ups with your knees out straight. If partial sit-ups are difficult, simply do the above but with only tightening your belly  (abdominal) muscles and holding it as told. °Hip-Lift. °· Lie on your back with your knees flexed 90 degrees. Push down with your feet and shoulders as you raise your hips 2 inches off the floor. Hold for 10 seconds, repeat 5 to 10 times. °Back Arches. °· Lie on your stomach. Prop yourself up on bent elbows. Slowly press on your hands, causing an arch in your low back. Repeat 3 to 5 times. °Shoulder-Lifts. °· Lie face down with arms beside your body. Keep hips and belly pressed to floor as you slowly lift your head and shoulders off the floor. °Do not overdo your exercises. Be careful in the beginning. Exercises may cause you some mild back discomfort. If the pain lasts for more than 15 minutes, stop the exercises until you see your doctor. Improvement with exercise for back problems is slow.  °Document Released: 03/27/2010 Document Revised: 05/17/2011 Document Reviewed: 12/24/2010 °[employer]® Patient Information ©  2015 [employer]. This information is not intended to replace advice given to you by your health care provider. Make sure you discuss any questions you have with your health care provider.

## 2014-08-04 NOTE — ED Notes (Signed)
Patient c/o mid to lower back pain that radiates into lower abd/pelvic region. [facility] starting in mid back and was tested for UTI but was told it was negative. Patient now states she has prssure and burning with urination. Denies any nausea, vomiting, diarrhea, or fevers.

## 2014-08-04 NOTE — ED Provider Notes (Signed)
CSN: [id number]     Arrival date & time 08/04/14  1244 History   First MD Initiated Contact with Patient 08/04/14 1303     Chief Complaint  Patient presents with  . Back Pain     (Consider location/radiation/quality/duration/timing/severity/associated sxs/prior Treatment) HPI  My upper back has been hurting for two weeks on both sides, radiating down to low back and around to "private parts". It  Burns when I pee.  Urine is dark orange colored.  I was recently on bactrim for a cyst under my arm which it didn't really help completely. Today it is getting sharp pains going though uppper to lower back like a strap around.   Denies dyspnea, but has some feelings with anxiety.  NO vomiting, fever,chills, abnormal vaginal dishcarge.  Normal menses- lmp two weeks ago,  Using condoms,  G3a3.  STates increased frequency of urinating and stooling.   She has been seen multiple times for anxiety- started on beta blocker, but out of all of them.  Using xanax prn, which she still has a few of.     Past Medical History  Diagnosis Date  . Hydradenitis   . Polycystic ovarian disease   . Depression   . Chronic back pain   . Hypothyroid   . Panic attack   . Essential hypertension    Past Surgical History  Procedure Laterality Date  . Cholecystectomy    . Dilation and curettage of uterus     History reviewed. No pertinent family history. History  Substance Use Topics  . Smoking status: Current Some Day Smoker -- 0.25 packs/day for 9 years    Types: Cigarettes    Last Attempt to Quit: 03/08/2012  . Smokeless tobacco: Never Used  . Alcohol Use: 0.0 oz/week    0 Standard drinks or equivalent per week     Comment: Rarely   OB History    Gravida Para Term Preterm AB TAB SAB Ectopic Multiple Living   Review of Systems    Allergies  Tramadol; Ativan; and Banana  Home Medications   Prior to Admission medications   Medication Sig Start Date End Date Taking? Authorizing  Provider  acetaminophen (TYLENOL) 500 MG tablet Take 1,000 mg by mouth every 6 (six) hours as needed for mild pain or headache.    Historical Provider, MD  ALPRAZolam Prudy Feeler) 0.5 MG tablet Take 1 tablet (0.5 mg total) by mouth 3 (three) times daily as needed for anxiety. 07/18/14   Rolland Porter, MD  butalbital-acetaminophen-caffeine (FIORICET) [phone] MG per tablet Take 1 tablet by mouth every 6 (six) hours as needed for headache. 06/05/14 06/05/15  Burgess Amor, PA-C  CRANBERRY PO Take 2 capsules by mouth daily.    Historical Provider, MD  diphenhydrAMINE (BENADRYL) 25 mg capsule Take 25 mg by mouth every 6 (six) hours as needed for itching or allergies.    Historical Provider, MD  HYDROcodone-acetaminophen (NORCO/VICODIN) 5-325 MG per tablet Take 1 tablet by mouth every 6 (six) hours as needed. 06/20/14   [name], MD  hydrOXYzine (ATARAX/VISTARIL) 10 MG tablet Take 1 tablet (10 mg total) by mouth 3 (three) times daily as needed. 07/18/14   Rolland Porter, MD  ibuprofen (ADVIL,MOTRIN) 600 MG tablet Take 1 tablet (600 mg total) by mouth every 6 (six) hours as needed. 06/20/14   [name], MD  levothyroxine (SYNTHROID, LEVOTHROID) 75 MCG tablet Take 75 mcg by mouth daily before breakfast.  Historical Provider, MD  loratadine (CLARITIN) 10 MG tablet Take 10 mg by mouth daily as needed for allergies.    Historical Provider, MD  LORazepam (ATIVAN) 1 MG tablet Take 1 tablet (1 mg total) by mouth 3 (three) times daily as needed for anxiety. Patient not taking: Reported on 06/05/2014 05/08/14   Paula LibraJohn Molpus, MD  meclizine (ANTIVERT) 32 MG tablet Take 1 tablet (32 mg total) by mouth 3 (three) times daily as needed. Patient taking differently: Take 32 mg by mouth 3 (three) times daily as needed for dizziness or nausea.  06/20/14   Derwood KaplanAnkit Nanavati, MD  metoprolol tartrate (LOPRESSOR) 25 MG tablet Take 1 tablet (25 mg total) by mouth 2 (two) times daily. 07/18/14   Rolland PorterMark James, MD  Multiple Vitamin (MULTIVITAMIN WITH  MINERALS) TABS tablet Take 1 tablet by mouth daily.    Historical Provider, MD  potassium chloride 20 MEQ TBCR Take 10 mEq by mouth daily. Patient not taking: Reported on 06/05/2014 05/07/14   Kristen N Ward, DO   BP 144/81 mmHg  Pulse 102  Temp(Src) 97.7 F (36.5 C) (Oral)  Resp 18  SpO2 100%  LMP 07/25/2014 Physical Exam  ED Course  Procedures (including critical care time) Labs Review Labs Reviewed  URINALYSIS, ROUTINE W REFLEX MICROSCOPIC (NOT [facility])    Imaging Review No results found.   EKG Interpretation None      MDM   Final diagnoses:  Low back pain without sciatica, unspecified back pain laterality    29 year old female history of chronic back pain and anxiety who presents today complaining of diffuse back pain has been present for the past 2 weeks. She is able to move causes it to get worse. She has been taking ibuprofen and acetaminophen without improvement. She is walking and reports doing some core exercises including stretching to improve her symptoms. She has a normal neurological exam here today. Back reveals no signs of redness, inflammation, or trauma. She reports some bilateral flank radiation but urine appears to not be infected. She is advised to follow-up with primary care and she advises that she has recently established primary care. She is out of her Lopressor and she will be given a prescription for this. She is advised regarding return precautions including changes in the type pain, fever, chills, pain relocating into the abdomen or chest, worsening chest pain or dyspnea. She is also advised regarding walking and gentle stretching, use of non-steroidal anti-inflammatory medications, and other conservative measures for her therapy. She voices understanding.  Margarita Grizzleanielle Jemiah Cuadra, MD 08/04/14 [phone]

## 2014-08-04 NOTE — ED Notes (Signed)
Pt made aware to return if symptoms worsen or if any life threatening symptoms occur.   

## 2014-08-12 ENCOUNTER — Encounter: Payer: Self-pay | Admitting: Cardiology

## 2014-08-12 ENCOUNTER — Ambulatory Visit (INDEPENDENT_AMBULATORY_CARE_PROVIDER_SITE_OTHER): Payer: Self-pay | Admitting: Cardiology

## 2014-08-12 VITALS — BP 120/78 | HR 82 | Ht 63.0 in | Wt 345.0 lb

## 2014-08-12 DIAGNOSIS — F419 Anxiety disorder, unspecified: Secondary | ICD-10-CM

## 2014-08-12 DIAGNOSIS — F329 Major depressive disorder, single episode, unspecified: Secondary | ICD-10-CM

## 2014-08-12 DIAGNOSIS — I471 Supraventricular tachycardia: Secondary | ICD-10-CM

## 2014-08-12 DIAGNOSIS — R000 Tachycardia, unspecified: Secondary | ICD-10-CM

## 2014-08-12 DIAGNOSIS — F32A Depression, unspecified: Secondary | ICD-10-CM

## 2014-08-12 DIAGNOSIS — F418 Other specified anxiety disorders: Secondary | ICD-10-CM

## 2014-08-12 NOTE — Progress Notes (Signed)
Cardiology Office Note  Date: 08/12/2014   ID: Laurie Macias, DOB 11/11/1985, MRN [id number]  PCP: Alda LeaMcElroy, Shannon G, PA-C  Primary Cardiologist: Nona DellSamuel Elbony Mcclimans, MD   Chief Complaint  Patient presents with  . Tachycardia    History of Present Illness: Laurie Macias is a 29 y.o. female referred for cardiology consultation by Ms Susa RaringMcElroy PA-C with [facility]. Available records were reviewed, she has had multiple ER encounters for different reasons over the last few months, most recently including headaches, panic attacks, and back pain. She has been noted to be tachycardic, and is referred for further assessment.  She is here today with significant other, states that she has been having progressing problems with anxiety over the last several weeks to months. At the present time she is still trying to establish a regular follow-up pattern with a behavioral health specialist. I reviewed her current medications, she has been using as needed Xanax, is not on any other antidepressives. It sounds like she was previously on citalopram although this was stopped due to concerns about prolonged QT interval. She states that when she starts to feel anxious and worried, her heart rate increases, which has the affect of making her feel more anxious. She has low dose Lopressor which she uses as needed when she feels anxious. She does not take it regularly because it makes her feel somewhat washed out.  She has no clearly documented history of cardiac arrhythmia. Recent available telemetry strips and ECGs reviewed, tachycardia noted to be sinus in origin. She states that when she or her significant other checks her heart rate at home at rest, recently it has been in the 70s to 80s. She has not had any syncope on standing. No reproducible exertional chest pain.  She was not orthostatic today, and in fact heart rate was consistently under 100 during evaluation with orthostatic measurements. On my  examination her heart rate was in the 80s.  CT examinations of the head and neck done in March and April reported no acute abnormalities. Chest x-ray from May reported no acute process.   Past Medical History  Diagnosis Date  . Hydradenitis   . Polycystic ovarian disease   . Depression   . Chronic back pain   . Hypothyroid   . Panic attack   . Essential hypertension     Past Surgical History  Procedure Laterality Date  . Cholecystectomy    . Dilation and curettage of uterus      Current Outpatient Prescriptions  Medication Sig Dispense Refill  . acetaminophen (TYLENOL) 500 MG tablet Take 1,000 mg by mouth every 6 (six) hours as needed for mild pain or headache.    Marland Kitchen. HYDROcodone-acetaminophen (NORCO/VICODIN) 5-325 MG per tablet Take 1 tablet by mouth every 6 (six) hours as needed. 6 tablet 0  . hydrOXYzine (ATARAX/VISTARIL) 10 MG tablet Take 1 tablet (10 mg total) by mouth 3 (three) times daily as needed. 30 tablet 0  . ibuprofen (ADVIL,MOTRIN) 200 MG tablet Take 200 mg by mouth 3 (three) times daily.    Marland Kitchen. levothyroxine (SYNTHROID, LEVOTHROID) 75 MCG tablet Take 75 mcg by mouth daily before breakfast.    . loratadine (CLARITIN) 10 MG tablet Take 10 mg by mouth daily as needed for allergies.    Marland Kitchen. meclizine (ANTIVERT) 32 MG tablet Take 1 tablet (32 mg total) by mouth 3 (three) times daily as needed. (Patient taking differently: Take 32 mg by mouth 3 (three) times daily as needed for  dizziness or nausea. ) 30 tablet 0  . metoprolol tartrate (LOPRESSOR) 25 MG tablet Take 1 tablet (25 mg total) by mouth 2 (two) times daily as needed (anxiety). 20 tablet 0  . Multiple Vitamin (MULTIVITAMIN WITH MINERALS) TABS tablet Take 1 tablet by mouth daily.    . potassium chloride 20 MEQ TBCR Take 10 mEq by mouth daily. 5 tablet 0  . ALPRAZolam (XANAX) 0.5 MG tablet Take 1 tablet (0.5 mg total) by mouth 3 (three) times daily as needed for anxiety. (Patient not taking: Reported on 08/12/2014) 12 tablet 0    No current facility-administered medications for this visit.    Allergies:  Tramadol; Ativan; and Banana   Social History: The patient  reports that she has been smoking Cigarettes.  She started smoking about 11 years ago. She has a 2.25 pack-year smoking history. She has never used smokeless tobacco. She reports that she drinks alcohol. She reports that she does not use illicit drugs.   Family History: The patient's family history is not on file.   ROS:  Please see the history of present illness. Otherwise, complete review of systems is positive for anxiety and worry.  All other systems are reviewed and negative.   Physical Exam: VS:  BP 120/78 mmHg  Pulse 82  Ht  (1.6 m)  Wt 345 lb (156.491 kg)  BMI 61.13 kg/m2  LMP 07/25/2014, BMI Body mass index is 61.13 kg/(m^2).  Wt Readings from Last 3 Encounters:  08/12/14 345 lb (156.491 kg)  07/18/14 330 lb (149.687 kg)  07/16/14 330 lb (149.687 kg)     General: Moderately obese woman in no distress. HEENT: Conjunctiva and lids normal, oropharynx clear. Neck: Supple, no elevated JVP or carotid bruits, no thyromegaly. Lungs: Clear to auscultation, nonlabored breathing at rest. Cardiac: Regular rate and rhythm, no S3 or significant systolic murmur, no pericardial rub. Abdomen: Soft, nontender, bowel sounds present, no guarding or rebound. Extremities: No pitting edema, distal pulses 2+. Skin: Warm and dry. Musculoskeletal: No kyphosis. Neuropsychiatric: Alert and oriented x3, affect grossly appropriate.   ECG: ECG is not ordered today.   Recent Labwork: 05/07/2014: ALT 18; AST 22 05/08/2014: TSH 3.078 07/18/2014: BUN 9; Creatinine 0.66; Hemoglobin 13.6; Platelets 294; Potassium 3.5; Sodium 139  07/04/2014: Cholesterol 179, triglycerides 130, HDL 45, LDL 108.   Assessment and Plan:  1. Intermittent sinus tachycardia as outlined above. No clear evidence of any other dysrhythmias based on review of available ECGs and telemetry  strips. Orthostatic measurements done today were negative, she had no significant blood pressure change, and importantly her heart rate was consistently under 100. This argues against postural orthostatic tachycardia syndrome. It sounds like anxiety is playing a big role in her symptoms, and I have recommended that she continue efforts toward establishing regular follow-up with a behavioral health specialist. At this point, no further cardiac testing is anticipated.  2. Polycystic ovary disease and obesity.  3. Reported history and anxiety and depression.  Current medicines were reviewed with the patient today.   Disposition: FU as needed.   Signed, Jonelle Sidle, MD, [facility] 08/12/2014 3:06 PM    [facility] at [facility] [address] [place], [place], [place] [address] Phone: [phone]; Fax: [phone]

## 2014-08-12 NOTE — Patient Instructions (Signed)
Your physician recommends that you schedule a follow-up appointment in:  AS NEEDED with Dr. McDowell  Your physician recommends that you continue on your current medications as directed. Please refer to the Current Medication list given to you today.  Thanks for choosing [facility]!!!    

## 2014-12-23 ENCOUNTER — Emergency Department (HOSPITAL_COMMUNITY)
Admission: EM | Admit: 2014-12-23 | Discharge: 2014-12-24 | Disposition: A | Discharge status: Home / Self Care | Payer: Self-pay | Attending: Emergency Medicine | Admitting: Emergency Medicine

## 2014-12-23 ENCOUNTER — Encounter (HOSPITAL_COMMUNITY): Payer: Self-pay | Admitting: *Deleted

## 2014-12-23 DIAGNOSIS — Z720 Tobacco use: Secondary | ICD-10-CM | POA: Insufficient documentation

## 2014-12-23 DIAGNOSIS — M545 Low back pain, unspecified: Secondary | ICD-10-CM

## 2014-12-23 DIAGNOSIS — Z79899 Other long term (current) drug therapy: Secondary | ICD-10-CM | POA: Insufficient documentation

## 2014-12-23 DIAGNOSIS — I10 Essential (primary) hypertension: Secondary | ICD-10-CM | POA: Insufficient documentation

## 2014-12-23 DIAGNOSIS — R112 Nausea with vomiting, unspecified: Secondary | ICD-10-CM | POA: Insufficient documentation

## 2014-12-23 DIAGNOSIS — R109 Unspecified abdominal pain: Secondary | ICD-10-CM | POA: Insufficient documentation

## 2014-12-23 DIAGNOSIS — G8929 Other chronic pain: Secondary | ICD-10-CM | POA: Insufficient documentation

## 2014-12-23 DIAGNOSIS — Z872 Personal history of diseases of the skin and subcutaneous tissue: Secondary | ICD-10-CM | POA: Insufficient documentation

## 2014-12-23 DIAGNOSIS — Z8639 Personal history of other endocrine, nutritional and metabolic disease: Secondary | ICD-10-CM | POA: Insufficient documentation

## 2014-12-23 DIAGNOSIS — R202 Paresthesia of skin: Secondary | ICD-10-CM | POA: Insufficient documentation

## 2014-12-23 DIAGNOSIS — F410 Panic disorder [episodic paroxysmal anxiety] without agoraphobia: Secondary | ICD-10-CM | POA: Insufficient documentation

## 2014-12-23 DIAGNOSIS — R002 Palpitations: Secondary | ICD-10-CM | POA: Insufficient documentation

## 2014-12-23 DIAGNOSIS — M791 Myalgia, unspecified site: Secondary | ICD-10-CM

## 2014-12-23 DIAGNOSIS — F329 Major depressive disorder, single episode, unspecified: Secondary | ICD-10-CM | POA: Insufficient documentation

## 2014-12-23 DIAGNOSIS — N939 Abnormal uterine and vaginal bleeding, unspecified: Secondary | ICD-10-CM | POA: Insufficient documentation

## 2014-12-23 DIAGNOSIS — R42 Dizziness and giddiness: Secondary | ICD-10-CM | POA: Insufficient documentation

## 2014-12-23 LAB — MAGNESIUM: Magnesium: 1.9 mg/dL (ref 1.7–2.4)

## 2014-12-23 LAB — CBC WITH DIFFERENTIAL/PLATELET
BASOS ABS: 0 10*3/uL (ref 0.0–0.1)
Basophils Relative: 0 %
Eosinophils Absolute: 0.1 10*3/uL (ref 0.0–0.7)
Eosinophils Relative: 1 %
HEMATOCRIT: 41.4 % (ref 36.0–46.0)
Hemoglobin: 14.1 g/dL (ref 12.0–15.0)
LYMPHS PCT: 24 %
Lymphs Abs: 2.2 10*3/uL (ref 0.7–4.0)
MCH: 29.3 pg (ref 26.0–34.0)
MCHC: 34.1 g/dL (ref 30.0–36.0)
MCV: 86.1 fL (ref 78.0–100.0)
MONO ABS: 0.5 10*3/uL (ref 0.1–1.0)
Monocytes Relative: 5 %
Neutro Abs: 6.4 10*3/uL (ref 1.7–7.7)
Neutrophils Relative %: 70 %
Platelets: 258 10*3/uL (ref 150–400)
RBC: 4.81 MIL/uL (ref 3.87–5.11)
RDW: 14.3 % (ref 11.5–15.5)
WBC: 9.2 10*3/uL (ref 4.0–10.5)

## 2014-12-23 LAB — BASIC METABOLIC PANEL
ANION GAP: 8 (ref 5–15)
BUN: 7 mg/dL (ref 6–20)
CO2: 26 mmol/L (ref 22–32)
Calcium: 9.1 mg/dL (ref 8.9–10.3)
Chloride: 105 mmol/L (ref 101–111)
Creatinine, Ser: 0.68 mg/dL (ref 0.44–1.00)
GFR calc Af Amer: >60 mL/min (ref >60)
GFR calc non Af Amer: >60 mL/min (ref >60)
Glucose, Bld: 109 mg/dL — ABNORMAL HIGH (ref 65–99)
POTASSIUM: 4.1 mmol/L (ref 3.5–5.1)
Sodium: 139 mmol/L (ref 135–145)

## 2014-12-23 LAB — I-STAT BETA HCG BLOOD, ED (MC, WL, AP ONLY)

## 2014-12-23 LAB — PHOSPHORUS: Phosphorus: 2 mg/dL — ABNORMAL LOW (ref 2.5–4.6)

## 2014-12-23 LAB — CK: Total CK: 35 U/L — ABNORMAL LOW (ref 38–234)

## 2014-12-23 MED ORDER — KETOROLAC TROMETHAMINE 30 MG/ML IJ SOLN
30.0000 mg | Freq: Once | INTRAMUSCULAR | Status: AC
  Administered 2014-12-23: 30 mg via INTRAVENOUS
  Filled 2014-12-23: qty 1

## 2014-12-23 MED ORDER — DIAZEPAM 5 MG/ML IJ SOLN
10.0000 mg | Freq: Once | INTRAMUSCULAR | Status: AC
  Administered 2014-12-23: 10 mg via INTRAVENOUS
  Filled 2014-12-23: qty 2

## 2014-12-23 MED ORDER — SODIUM CHLORIDE 0.9 % IV BOLUS (SEPSIS): 1000.0000 mL | Freq: Once | INTRAVENOUS | Status: DC

## 2014-12-23 MED ORDER — SODIUM CHLORIDE 0.9 % IV BOLUS (SEPSIS)
500.0000 mL | Freq: Once | INTRAVENOUS | Status: AC
  Administered 2014-12-23: 500 mL via INTRAVENOUS

## 2014-12-23 NOTE — ED Notes (Addendum)
Pt states she has been having muscles spasms and pain x 1 month but for the last couple of days it has gotten worse; pt also c/o dizziness and lower back pain

## 2014-12-23 NOTE — ED Notes (Signed)
EKG done and given to EDP. Patient states that she is gonna have a panic attack. Patient started to breath fast and heavily, tearful.

## 2014-12-23 NOTE — ED Provider Notes (Signed)
CSN: [id number]     Arrival date & time 12/23/14  2013 History  By signing my name below, I, Budd PalmerVanessa Prueter, attest that this documentation has been prepared under the direction and in the presence of Lavera Guiseana Duo Berlin Viereck, MD. Electronically Signed: Budd PalmerVanessa Prueter, ED Scribe. 12/23/2014. 10:26 PM.    Chief Complaint  Patient presents with  . Generalized Body Aches   The history is provided by the patient. No language interpreter was used.   HPI Comments: Laurie ChuteJulia M Macias is a 29 y.o. female who presents to the Emergency Department complaining of generalized body aches onset 1 month ago after doing some exercises. Pt states the pain has progressively worsened since then. She reports associated lightheaded, occasional n/v, abdominal spasms, lower back pain radiating down her buttocks, leg pain, palpitations, "burning/tigling sensation all over", and cold hands and feet. She notes she was seen for the same in the ED twice before where she was most recently diagnosed with slightly low potassium and discharged. She notes she was placed on metformin a few months ago, but notes she is currently between doctors and has not taken it recently. She states she is also on synthroid. She reports her blood glucose level was 117 yesterday. She denies any recent exertional activity. Pt denies diarrhea, rhinorrhea, vaginal discharge, and urinary symptoms.   Past Medical History  Diagnosis Date  . Hydradenitis   . Polycystic ovarian disease   . Depression   . Chronic back pain   . Hypothyroid   . Panic attack   . Essential hypertension    Past Surgical History  Procedure Laterality Date  . Cholecystectomy    . Dilation and curettage of uterus     History reviewed. No pertinent family history. Social History  Substance Use Topics  . Smoking status: Current Some Day Smoker -- 0.25 packs/day for 9 years    Types: Cigarettes    Start date: 08/12/2003    Last Attempt to Quit: 03/08/2012  . Smokeless tobacco:  Never Used  . Alcohol Use: 0.0 oz/week    0 Standard drinks or equivalent per week     Comment: Rarely   OB History    Gravida Para Term Preterm AB TAB SAB Ectopic Multiple Living   3    3  3         Review of Systems  HENT: Negative for rhinorrhea.   Cardiovascular: Positive for palpitations.  Gastrointestinal: Positive for nausea, vomiting and abdominal pain. Negative for diarrhea.  Genitourinary: Positive for vaginal bleeding. Negative for dysuria, vaginal discharge and difficulty urinating.  Musculoskeletal: Positive for myalgias and back pain.  All other systems reviewed and are negative.   Allergies  Tramadol; Ativan; Banana; and Tylenol with codeine #3  Home Medications   Prior to Admission medications   Medication Sig Start Date End Date Taking? Authorizing Provider  acetaminophen (TYLENOL) 500 MG tablet Take 1,000 mg by mouth every 6 (six) hours as needed for mild pain or headache.   Yes Historical Provider, MD  ibuprofen (ADVIL,MOTRIN) 200 MG tablet Take 400 mg by mouth every 8 (eight) hours as needed for mild pain or moderate pain.    Yes Historical Provider, MD  Multiple Vitamin (MULTIVITAMIN WITH MINERALS) TABS tablet Take 1 tablet by mouth daily.   Yes Historical Provider, MD  ALPRAZolam Prudy Feeler(XANAX) 0.5 MG tablet Take 1 tablet (0.5 mg total) by mouth 3 (three) times daily as needed for anxiety. Patient not taking: Reported on 08/12/2014 07/18/14   Rolland PorterMark James, MD  cyclobenzaprine (FLEXERIL) 10 MG tablet Take 1 tablet (10 mg total) by mouth 2 (two) times daily as needed for muscle spasms. 12/24/14   Lavera Guise, MD  ibuprofen (ADVIL,MOTRIN) 800 MG tablet Take 1 tablet (800 mg total) by mouth every 8 (eight) hours as needed for moderate pain. 12/24/14   Lavera Guise, MD  meclizine (ANTIVERT) 32 MG tablet Take 1 tablet (32 mg total) by mouth 3 (three) times daily as needed. Patient not taking: Reported on 12/23/2014 06/20/14   [name], MD  metoprolol tartrate (LOPRESSOR)  25 MG tablet Take 1 tablet (25 mg total) by mouth 2 (two) times daily as needed (anxiety). Patient not taking: Reported on 12/23/2014 08/04/14   Margarita Grizzle, MD   BP 136/83 mmHg  Pulse 90  Temp(Src) 98.1 F (36.7 C) (Axillary)  Resp 18  Ht  (1.6 m)  Wt 330 lb (149.687 kg)  BMI 58.47 kg/m2  SpO2 100% Physical Exam Physical Exam  Nursing note and vitals reviewed. Constitutional: Well developed, well nourished, non-toxic, and in no acute distress Head: Normocephalic and atraumatic.  Mouth/Throat: Oropharynx is clear and moist.  Neck: Normal range of motion. Neck supple.  Cardiovascular: Normal rate and regular rhythm.   Pulmonary/Chest: Effort normal and breath sounds normal.  Abdominal: Soft. There is no tenderness. There is no rebound and no guarding.  Musculoskeletal: Normal range of motion. Diffuse TTP to all muscles on the extremities. Slight TTP to right paraspinal muscles of the lower back. Neurological: Alert, no facial droop, fluent speech, moves all extremities symmetrically Skin: Skin is warm and dry.  Psychiatric: Cooperative  ED Course  Procedures  DIAGNOSTIC STUDIES: Oxygen Saturation is 100% on RA, normal by my interpretation.    COORDINATION OF CARE: 10:21 PM - Discussed possible muscle spasms or breakdown. Discussed plans to order diagnostic studies, IV fluids, and pain medication. Pt advised of plan for treatment and pt agrees.  Labs Review Labs Reviewed  BASIC METABOLIC PANEL - Abnormal; Notable for the following:    Glucose, Bld 109 (*)    All other components within normal limits  PHOSPHORUS - Abnormal; Notable for the following:    Phosphorus 2.0 (*)    All other components within normal limits  CK - Abnormal; Notable for the following:    Total CK 35 (*)    All other components within normal limits  URINALYSIS, ROUTINE W REFLEX MICROSCOPIC (NOT AT [facility]) - Abnormal; Notable for the following:    Leukocytes, UA TRACE (*)    All other components  within normal limits  URINE MICROSCOPIC-ADD ON - Abnormal; Notable for the following:    Squamous Epithelial / LPF MANY (*)    All other components within normal limits  CBC WITH DIFFERENTIAL/PLATELET  MAGNESIUM  I-STAT BETA HCG BLOOD, ED ([facility], [facility], [facility] ONLY)    Imaging Review No results found. I have personally reviewed and evaluated these images and lab results as part of my medical decision-making.   EKG Interpretation   Date/Time:  Monday December 23 2014 22:28:27 EDT Ventricular Rate:  105 PR Interval:  153 QRS Duration: 78 QT Interval:  368 QTC Calculation: 486 R Axis:   33 Text Interpretation:  Sinus tachycardia Borderline QT interval No  significant change since last tracing Confirmed by Daleah Coulson MD, Mylin Hirano ([id number]) on  12/23/2014 11:52:59 PM      MDM   Final diagnoses:  Myalgia  Right-sided low back pain without sciatica    30 year old female with history  of PCOS, chronic back pain, HTN who presents with diffuse body pain and multiple associated complaints. She is tearful extremely dramatic on me entering the room, but during my evaluation those sentiments spontaneously resolve and she appears comfortable and overall well appearing. Vitals are not concerning. She on exam has diffuse tenderness to palpation over all muscle groups of her extremities and to palpation of lower back. No electrolyte or metabolic derangements noted on blood work. Normal renal function and unremarkable CK, not suggestive of significant muscle breakdown/inflammation. Given toradol, IVF, and muscle relaxants for symptomatic control to good effect. Discussed supportive care for home. Low suspicion for serious etiology of symptoms. Strict return and follow-up instructions reviewed. She expressed understanding of all discharge instructions and felt comfortable with the plan of care.   I personally performed the services described in this documentation, which was scribed in my presence. The recorded information  has been reviewed and is accurate.   Lavera Guise, MD 12/24/14 [phone]

## 2014-12-23 NOTE — ED Notes (Signed)
Patient states that her muscles are still tight and her back pain has returned.

## 2014-12-23 NOTE — ED Notes (Signed)
Gave patient warm blanket as requested.  

## 2014-12-24 LAB — URINALYSIS, ROUTINE W REFLEX MICROSCOPIC
Bilirubin Urine: NEGATIVE
GLUCOSE, UA: NEGATIVE mg/dL
Hgb urine dipstick: NEGATIVE
Ketones, ur: NEGATIVE mg/dL
Nitrite: NEGATIVE
PROTEIN: NEGATIVE mg/dL
SPECIFIC GRAVITY, URINE: 1.01 (ref 1.005–1.030)
Urobilinogen, UA: 0.2 mg/dL (ref 0.0–1.0)
pH: 8 (ref 5.0–8.0)

## 2014-12-24 LAB — URINE MICROSCOPIC-ADD ON

## 2014-12-24 MED ORDER — CYCLOBENZAPRINE HCL 10 MG PO TABS: 10.0000 mg | ORAL_TABLET | Freq: Two times a day (BID) | ORAL | Status: DC | PRN

## 2014-12-24 MED ORDER — ACETAMINOPHEN 500 MG PO TABS
1000.0000 mg | ORAL_TABLET | Freq: Once | ORAL | Status: AC
  Administered 2014-12-24: 1000 mg via ORAL
  Filled 2014-12-24: qty 2

## 2014-12-24 MED ORDER — IBUPROFEN 800 MG PO TABS: 800.0000 mg | ORAL_TABLET | Freq: Three times a day (TID) | ORAL | Status: DC | PRN

## 2014-12-24 NOTE — Discharge Instructions (Signed)
Back Pain, Adult °Back pain is very common in adults. The cause of back pain is rarely dangerous and the pain often gets better over time. The cause of your back pain may not be known. Some common causes of back pain include: °· Strain of the muscles or ligaments supporting the spine. °· Wear and tear (degeneration) of the spinal disks. °· Arthritis. °· Direct injury to the back. °For many people, back pain may return. Since back pain is rarely dangerous, most people can learn to manage this condition on their own. °HOME CARE INSTRUCTIONS °Watch your back pain for any changes. The following actions may help to lessen any discomfort you are feeling: °· Remain active. It is stressful on your back to sit or stand in one place for long periods of time. Do not sit, drive, or stand in one place for more than 30 minutes at a time. Take short walks on even surfaces as soon as you are able. Try to increase the length of time you walk each day. °· Exercise regularly as directed by your health care provider. Exercise helps your back heal faster. It also helps avoid future injury by keeping your muscles strong and flexible. °· Do not stay in bed. Resting more than 1-2 days can delay your recovery. °· Pay attention to your body when you bend and lift. The most comfortable positions are those that put less stress on your recovering back. Always use proper lifting techniques, including: °¨ Bending your knees. °¨ Keeping the load close to your body. °¨ Avoiding twisting. °· Find a comfortable position to sleep. Use a firm mattress and lie on your side with your knees slightly bent. If you lie on your back, put a pillow under your knees. °· Avoid feeling anxious or stressed. Stress increases muscle tension and can worsen back pain. It is important to recognize when you are anxious or stressed and learn ways to manage it, such as with exercise. °· Take medicines only as directed by your health care provider. Over-the-counter  medicines to reduce pain and inflammation are often the most helpful. Your health care provider may prescribe muscle relaxant drugs. These medicines help dull your pain so you can more quickly return to your normal activities and healthy exercise. °· Apply ice to the injured area: °¨ Put ice in a plastic bag. °¨ Place a towel between your skin and the bag. °¨ Leave the ice on for 20 minutes, 2-3 times a day for the first 2-3 days. After that, ice and heat may be alternated to reduce pain and spasms. °· Maintain a healthy weight. Excess weight puts extra stress on your back and makes it difficult to maintain good posture. °SEEK MEDICAL CARE IF: °· You have pain that is not relieved with rest or medicine. °· You have increasing pain going down into the legs or buttocks. °· You have pain that does not improve in one week. °· You have night pain. °· You lose weight. °· You have a fever or chills. °SEEK IMMEDIATE MEDICAL CARE IF:  °· You develop new bowel or bladder control problems. °· You have unusual weakness or numbness in your arms or legs. °· You develop nausea or vomiting. °· You develop abdominal pain. °· You feel faint. °  °This information is not intended to replace advice given to you by your health care provider. Make sure you discuss any questions you have with your health care provider. °  °Document Released: 02/22/2005 Document Revised: 03/15/2014 Document Reviewed: 06/26/2013 °Elsevier Interactive Patient Education ©2016 [employer] ° °Muscle Pain, Adult °Muscle pain (myalgia) may be caused by many things, including: °· Overuse or muscle strain, especially if you are not in shape. This is the most common cause of muscle pain. °· Injury. °· Bruises. °· Viruses, such as the flu. °· Infectious diseases. °· Fibromyalgia, which is a chronic condition that causes muscle tenderness, fatigue, and headache. °· Autoimmune diseases, including lupus. °· Certain drugs, including ACE inhibitors and statins. °Muscle  pain may be mild or severe. In most cases, the pain lasts only a short time and goes away without treatment. To diagnose the cause of your muscle pain, your health care provider will take your medical history. This means he or she will ask you when your muscle pain began and what has been happening. If you have not had muscle pain for very long, your health care provider may want to wait before doing much testing. If your muscle pain has lasted a long time, your health care provider may want to run tests right away. If your health care provider thinks your muscle pain may be caused by illness, you may need to have additional tests to rule out certain conditions.  °Treatment for muscle pain depends on the cause. Home care is often enough to relieve muscle pain. Your health care provider may also prescribe anti-inflammatory medicine. °HOME CARE INSTRUCTIONS °Watch your condition for any changes. The following actions may help to lessen any discomfort you are feeling: °· Only take over-the-counter or prescription medicines as directed by your health care provider. °· Apply ice to the sore muscle: °¨ Put ice in a plastic bag. °¨ Place a towel between your skin and the bag. °¨ Leave the ice on for 15-20 minutes, 3-4 times a day. °· You may alternate applying hot and cold packs to the muscle as directed by your health care provider. °· If overuse is causing your muscle pain, slow down your activities until the pain goes away. °¨ Remember that it is normal to feel some muscle pain after starting a workout program. Muscles that have not been used often will be sore at first. °¨ Do regular, gentle exercises if you are not usually active. °¨ Warm up before exercising to lower your risk of muscle pain. °· Do not continue working out if the pain is very bad. Bad pain could mean you have injured a muscle. °SEEK MEDICAL CARE IF: °· Your muscle pain gets worse, and medicines do not help. °· You have muscle pain that lasts longer  than 3 days. °· You have a rash or fever along with muscle pain. °· You have muscle pain after a tick bite. °· You have muscle pain while working out, even though you are in good physical condition. °· You have redness, soreness, or swelling along with muscle pain. °· You have muscle pain after starting a new medicine or changing the dose of a medicine. °SEEK IMMEDIATE MEDICAL CARE IF: °· You have trouble breathing. °· You have trouble swallowing. °· You have muscle pain along with a stiff neck, fever, and vomiting. °· You have severe muscle weakness or cannot move part of your body. °MAKE SURE YOU:  °· Understand these instructions. °· Will watch your condition. °· Will get help right away if you are not doing well or get worse. °  °This information is not intended to replace advice given to you by your health care provider. Make sure you discuss any questions you have with your health care provider. °  °  Document Released: 01/14/2006 Document Revised: 03/15/2014 Document Reviewed: 12/19/2012 °Elsevier Interactive Patient Education ©2016 [employer] ° °

## 2015-01-10 ENCOUNTER — Encounter (HOSPITAL_COMMUNITY): Payer: Self-pay | Admitting: *Deleted

## 2015-01-10 ENCOUNTER — Emergency Department (HOSPITAL_COMMUNITY)
Admission: EM | Admit: 2015-01-10 | Discharge: 2015-01-11 | Disposition: A | Discharge status: Home / Self Care | Payer: Self-pay | Attending: Emergency Medicine | Admitting: Emergency Medicine

## 2015-01-10 DIAGNOSIS — Z8659 Personal history of other mental and behavioral disorders: Secondary | ICD-10-CM | POA: Insufficient documentation

## 2015-01-10 DIAGNOSIS — G8929 Other chronic pain: Secondary | ICD-10-CM | POA: Insufficient documentation

## 2015-01-10 DIAGNOSIS — Z872 Personal history of diseases of the skin and subcutaneous tissue: Secondary | ICD-10-CM | POA: Insufficient documentation

## 2015-01-10 DIAGNOSIS — M791 Myalgia, unspecified site: Secondary | ICD-10-CM

## 2015-01-10 DIAGNOSIS — I10 Essential (primary) hypertension: Secondary | ICD-10-CM | POA: Insufficient documentation

## 2015-01-10 DIAGNOSIS — R000 Tachycardia, unspecified: Secondary | ICD-10-CM | POA: Insufficient documentation

## 2015-01-10 DIAGNOSIS — Z720 Tobacco use: Secondary | ICD-10-CM | POA: Insufficient documentation

## 2015-01-10 LAB — COMPREHENSIVE METABOLIC PANEL
ALBUMIN: 3.6 g/dL (ref 3.5–5.0)
ALT: 21 U/L (ref 14–54)
AST: 18 U/L (ref 15–41)
Alkaline Phosphatase: 79 U/L (ref 38–126)
Anion gap: 6 (ref 5–15)
BUN: 8 mg/dL (ref 6–20)
CO2: 29 mmol/L (ref 22–32)
Calcium: 8.8 mg/dL — ABNORMAL LOW (ref 8.9–10.3)
Chloride: 104 mmol/L (ref 101–111)
Creatinine, Ser: 0.58 mg/dL (ref 0.44–1.00)
GFR calc Af Amer: >60 mL/min (ref >60)
Glucose, Bld: 118 mg/dL — ABNORMAL HIGH (ref 65–99)
POTASSIUM: 4.3 mmol/L (ref 3.5–5.1)
SODIUM: 139 mmol/L (ref 135–145)
Total Bilirubin: 0.6 mg/dL (ref 0.3–1.2)
Total Protein: 7 g/dL (ref 6.5–8.1)

## 2015-01-10 LAB — CBC WITH DIFFERENTIAL/PLATELET
BASOS ABS: 0 10*3/uL (ref 0.0–0.1)
BASOS PCT: 0 %
EOS PCT: 1 %
Eosinophils Absolute: 0.1 10*3/uL (ref 0.0–0.7)
HCT: 39.6 % (ref 36.0–46.0)
Hemoglobin: 13.1 g/dL (ref 12.0–15.0)
Lymphocytes Relative: 26 %
Lymphs Abs: 1.7 10*3/uL (ref 0.7–4.0)
MCH: 29 pg (ref 26.0–34.0)
MCHC: 33.1 g/dL (ref 30.0–36.0)
MCV: 87.6 fL (ref 78.0–100.0)
Monocytes Absolute: 0.5 10*3/uL (ref 0.1–1.0)
Monocytes Relative: 8 %
Neutro Abs: 4.1 10*3/uL (ref 1.7–7.7)
Neutrophils Relative %: 65 %
PLATELETS: 241 10*3/uL (ref 150–400)
RBC: 4.52 MIL/uL (ref 3.87–5.11)
RDW: 14.2 % (ref 11.5–15.5)
WBC: 6.4 10*3/uL (ref 4.0–10.5)

## 2015-01-10 LAB — TSH: TSH: 1.939 u[IU]/mL (ref 0.350–4.500)

## 2015-01-10 LAB — CK: Total CK: 35 U/L — ABNORMAL LOW (ref 38–234)

## 2015-01-10 MED ORDER — KETOROLAC TROMETHAMINE 60 MG/2ML IM SOLN
60.0000 mg | Freq: Once | INTRAMUSCULAR | Status: AC
  Administered 2015-01-10: 60 mg via INTRAMUSCULAR
  Filled 2015-01-10: qty 2

## 2015-01-10 NOTE — ED Notes (Signed)
Muscle pain for over 2 months, crying in triage

## 2015-01-10 NOTE — ED Provider Notes (Signed)
History  By signing my name below, I, Karle PlumberJennifer Tensley, attest that this documentation has been prepared under the direction and in the presence of Donnetta HutchingBrian Alondria Mousseau, MD. Electronically Signed: Karle PlumberJennifer Tensley, ED Scribe. 01/10/2015. 9:38 PM  Chief Complaint  Patient presents with  . Muscle Pain   The history is provided by the patient and medical records. No language interpreter was used.    HPI Comments:  Laurie ChuteJulia M Macias is a 29 y.o. morbidly obese female who presents to the Emergency Department complaining of new onset severe muscle pain in all extremities and abdomen onset one month ago. She describes the pain as aching. Pt was seen here for similar symptoms 12/23/14 (about 2.5 weeks ago) and was diagnosed with myalgias and was prescribed Ibuprofen and Flexeril. Pt states she was seen by her PCP at [facility] here in [place] 6 days ago and referred to an endocrinologist for her thyroid but has not been given an appt yet. She states she is not currently taking her thyroid medication because it increases her heart rate. She has been taking Tylenol and Vicodin along the Ibuprofen and Flexeril that was prescribed at the previous visit. She denies modifying factors and states the pain moves around. She denies fever. Pt states she has had an MRI of her heart and lungs, lab work and has seen a cardiologist for her elevated heart rate that has been ongoing for the past year or so. She denies any recent trauma, injury or fall.  Past Medical History  Diagnosis Date  . Hydradenitis   . Polycystic ovarian disease   . Depression   . Chronic back pain   . Hypothyroid   . Panic attack   . Essential hypertension    Past Surgical History  Procedure Laterality Date  . Cholecystectomy    . Dilation and curettage of uterus     No family history on file. Social History  Substance Use Topics  . Smoking status: Current Some Day Smoker -- 0.25 packs/day for 9 years    Types:  Cigarettes    Start date: 08/12/2003    Last Attempt to Quit: 03/08/2012  . Smokeless tobacco: Never Used  . Alcohol Use: 0.0 oz/week    0 Standard drinks or equivalent per week     Comment: Rarely   OB History    Gravida Para Term Preterm AB TAB SAB Ectopic Multiple Living   3    3  3         Review of Systems A complete 10 system review of systems was obtained and all systems are negative except as noted in the HPI and PMH.   Allergies  Tramadol; Ativan; Banana; and Tylenol with codeine #3  Home Medications   Prior to Admission medications   Medication Sig Start Date End Date Taking? Authorizing Provider  acetaminophen (TYLENOL) 500 MG tablet Take 1,000 mg by mouth every 6 (six) hours as needed for mild pain or headache.   Yes Historical Provider, MD  cyclobenzaprine (FLEXERIL) 10 MG tablet Take 1 tablet (10 mg total) by mouth 2 (two) times daily as needed for muscle spasms. 12/24/14  Yes Lavera Guiseana Duo Liu, MD  ibuprofen (ADVIL,MOTRIN) 800 MG tablet Take 1 tablet (800 mg total) by mouth every 8 (eight) hours as needed for moderate pain. 12/24/14  Yes Lavera Guiseana Duo Liu, MD  loratadine (CLARITIN) 10 MG tablet Take 10 mg by mouth daily as needed for allergies.   Yes Historical Provider, MD  ketorolac (TORADOL)  10 MG tablet Take 1 tablet (10 mg total) by mouth every 6 (six) hours as needed. 01/11/15   Donnetta Hutching, MD   Triage Vitals: BP 138/82 mmHg  Pulse 163  Temp(Src) 97.6 F (36.4 C) (Oral)  Resp 23  Ht  (1.6 m)  Wt 330 lb (149.687 kg)  BMI 58.47 kg/m2  SpO2 100%  LMP 01/07/2015 Physical Exam  Constitutional: She is oriented to person, place, and time. She appears well-developed and well-nourished.  Morbidly obese. Intermittently tearful.  HENT:  Head: Normocephalic and atraumatic.  Eyes: Conjunctivae and EOM are normal. Pupils are equal, round, and reactive to light.  Neck: Normal range of motion. Neck supple.  Cardiovascular: Regular rhythm.  Tachycardia present.    Pulmonary/Chest: Effort normal and breath sounds normal.  Abdominal: Soft. Bowel sounds are normal.  Musculoskeletal: Normal range of motion. She exhibits tenderness. She exhibits no edema.  Tender to right medial knee, left proximal tibia and right distal medial humerus.  Neurological: She is alert and oriented to person, place, and time.  Skin: Skin is warm and dry.  Psychiatric: She has a normal mood and affect. Her behavior is normal.  Nursing note and vitals reviewed.   ED Course  Procedures (including critical care time) DIAGNOSTIC STUDIES: Oxygen Saturation is 100% on RA, normal by my interpretation.   COORDINATION OF CARE: 8:53 PM- Will order lab work including TSH and order injection of Toradol. Pt verbalizes understanding and agrees to plan.  Medications  ketorolac (TORADOL) injection 60 mg (60 mg Intramuscular Given 01/10/15 2143)    Labs Review Labs Reviewed  COMPREHENSIVE METABOLIC PANEL - Abnormal; Notable for the following:    Glucose, Bld 118 (*)    Calcium 8.8 (*)    All other components within normal limits  CK - Abnormal; Notable for the following:    Total CK 35 (*)    All other components within normal limits  CBC WITH DIFFERENTIAL/PLATELET  TSH    Imaging Review No results found. I have personally reviewed and evaluated these images and lab results as part of my medical decision-making.   EKG Interpretation None      MDM   Final diagnoses:  Muscle pain    Uncertain etiology of patient's diffuse muscular pain. However her labs including CBC, chemistry profile, CPK, TSH were all reassuring. Toradol seemed to help pain. Discharge medications oral Toradol. She has primary care follow-up  I personally performed the services described in this documentation, which was scribed in my presence. The recorded information has been reviewed and is accurate.      Donnetta Hutching, MD 01/11/15 [phone]

## 2015-01-10 NOTE — ED Notes (Signed)
MD at bedside. 

## 2015-01-10 NOTE — ED Notes (Signed)
Ambulatory to restroom without difficulty.

## 2015-01-11 MED ORDER — KETOROLAC TROMETHAMINE 10 MG PO TABS: 10.0000 mg | ORAL_TABLET | Freq: Four times a day (QID) | ORAL | Status: DC | PRN

## 2015-01-11 NOTE — ED Notes (Signed)
Discharge instructions given, pt demonstrated teach back and verbal understanding. No concerns voiced. Encouraged pt to look for PCP. She stated she would for follow ups.

## 2015-01-11 NOTE — Discharge Instructions (Signed)
Blood work was normal. Prescription for Toradol pills. Follow-up your primary care doctor.

## 2015-03-22 ENCOUNTER — Encounter (HOSPITAL_COMMUNITY): Payer: Self-pay

## 2015-03-22 ENCOUNTER — Emergency Department (HOSPITAL_COMMUNITY): Payer: Self-pay

## 2015-03-22 ENCOUNTER — Emergency Department (HOSPITAL_COMMUNITY)
Admission: EM | Admit: 2015-03-22 | Discharge: 2015-03-23 | Disposition: A | Discharge status: Home / Self Care | Payer: Self-pay | Attending: Emergency Medicine | Admitting: Emergency Medicine

## 2015-03-22 DIAGNOSIS — R000 Tachycardia, unspecified: Secondary | ICD-10-CM | POA: Insufficient documentation

## 2015-03-22 DIAGNOSIS — R0789 Other chest pain: Secondary | ICD-10-CM | POA: Insufficient documentation

## 2015-03-22 DIAGNOSIS — I10 Essential (primary) hypertension: Secondary | ICD-10-CM | POA: Insufficient documentation

## 2015-03-22 DIAGNOSIS — E039 Hypothyroidism, unspecified: Secondary | ICD-10-CM | POA: Insufficient documentation

## 2015-03-22 DIAGNOSIS — Z91040 Latex allergy status: Secondary | ICD-10-CM | POA: Insufficient documentation

## 2015-03-22 DIAGNOSIS — L299 Pruritus, unspecified: Secondary | ICD-10-CM | POA: Insufficient documentation

## 2015-03-22 DIAGNOSIS — F419 Anxiety disorder, unspecified: Secondary | ICD-10-CM | POA: Insufficient documentation

## 2015-03-22 DIAGNOSIS — F17210 Nicotine dependence, cigarettes, uncomplicated: Secondary | ICD-10-CM | POA: Insufficient documentation

## 2015-03-22 DIAGNOSIS — G8929 Other chronic pain: Secondary | ICD-10-CM | POA: Insufficient documentation

## 2015-03-22 DIAGNOSIS — K5900 Constipation, unspecified: Secondary | ICD-10-CM | POA: Insufficient documentation

## 2015-03-22 DIAGNOSIS — Z79899 Other long term (current) drug therapy: Secondary | ICD-10-CM | POA: Insufficient documentation

## 2015-03-22 DIAGNOSIS — Z7951 Long term (current) use of inhaled steroids: Secondary | ICD-10-CM | POA: Insufficient documentation

## 2015-03-22 DIAGNOSIS — R110 Nausea: Secondary | ICD-10-CM | POA: Insufficient documentation

## 2015-03-22 HISTORY — DX: Tachycardia, unspecified: R00.0

## 2015-03-22 MED ORDER — KETOROLAC TROMETHAMINE 30 MG/ML IJ SOLN
30.0000 mg | Freq: Once | INTRAMUSCULAR | Status: AC
  Administered 2015-03-22: 30 mg via INTRAVENOUS
  Filled 2015-03-22: qty 1

## 2015-03-22 NOTE — ED Notes (Signed)
I feel like something is tight around my chest and it is hard to breathe. It has been going on for a couple of months and has became worse. I have been to several doctors for the same thing and they give me muscle relaxer's.

## 2015-03-23 ENCOUNTER — Emergency Department (HOSPITAL_COMMUNITY): Payer: Self-pay

## 2015-03-23 LAB — D-DIMER, QUANTITATIVE (NOT AT ARMC): D DIMER QUANT: 0.52 ug{FEU}/mL — AB (ref 0.00–0.50)

## 2015-03-23 LAB — COMPREHENSIVE METABOLIC PANEL
ALK PHOS: 85 U/L (ref 38–126)
ALT: 22 U/L (ref 14–54)
ANION GAP: 9 (ref 5–15)
AST: 17 U/L (ref 15–41)
Albumin: 3.9 g/dL (ref 3.5–5.0)
BILIRUBIN TOTAL: 0.6 mg/dL (ref 0.3–1.2)
BUN: 12 mg/dL (ref 6–20)
CALCIUM: 9.4 mg/dL (ref 8.9–10.3)
CO2: 30 mmol/L (ref 22–32)
Chloride: 101 mmol/L (ref 101–111)
Creatinine, Ser: 0.76 mg/dL (ref 0.44–1.00)
GFR calc non Af Amer: >60 mL/min (ref >60)
Glucose, Bld: 122 mg/dL — ABNORMAL HIGH (ref 65–99)
Potassium: 4 mmol/L (ref 3.5–5.1)
SODIUM: 140 mmol/L (ref 135–145)
TOTAL PROTEIN: 8 g/dL (ref 6.5–8.1)

## 2015-03-23 LAB — CBC WITH DIFFERENTIAL/PLATELET
BASOS PCT: 0 %
Basophils Absolute: 0 10*3/uL (ref 0.0–0.1)
EOS ABS: 0.1 10*3/uL (ref 0.0–0.7)
Eosinophils Relative: 1 %
HCT: 45.8 % (ref 36.0–46.0)
HEMOGLOBIN: 15 g/dL (ref 12.0–15.0)
Lymphocytes Relative: 29 %
Lymphs Abs: 4.2 10*3/uL — ABNORMAL HIGH (ref 0.7–4.0)
MCH: 28.6 pg (ref 26.0–34.0)
MCHC: 32.8 g/dL (ref 30.0–36.0)
MCV: 87.2 fL (ref 78.0–100.0)
Monocytes Absolute: 0.9 10*3/uL (ref 0.1–1.0)
Monocytes Relative: 6 %
NEUTROS PCT: 64 %
Neutro Abs: 9.1 10*3/uL — ABNORMAL HIGH (ref 1.7–7.7)
Platelets: 325 10*3/uL (ref 150–400)
RBC: 5.25 MIL/uL — AB (ref 3.87–5.11)
RDW: 13.7 % (ref 11.5–15.5)
WBC: 14.4 10*3/uL — AB (ref 4.0–10.5)

## 2015-03-23 LAB — MAGNESIUM: MAGNESIUM: 1.9 mg/dL (ref 1.7–2.4)

## 2015-03-23 LAB — TROPONIN I: Troponin I: <0.03 ng/mL (ref <0.031)

## 2015-03-23 MED ORDER — IOHEXOL 350 MG/ML SOLN
125.0000 mL | Freq: Once | INTRAVENOUS | Status: AC | PRN
  Administered 2015-03-23: 125 mL via INTRAVENOUS

## 2015-03-23 MED ORDER — METHOCARBAMOL 500 MG PO TABS: ORAL_TABLET | ORAL | Status: DC

## 2015-03-23 MED ORDER — NAPROXEN 500 MG PO TABS: ORAL_TABLET | ORAL | Status: AC

## 2015-03-23 NOTE — ED Provider Notes (Signed)
CSN: [id number]     Arrival date & time 03/22/15  2207 History   First MD Initiated Contact with Patient 03/22/15 2258     Chief Complaint  Patient presents with  . Chest Pain     (Consider location/radiation/quality/duration/timing/severity/associated sxs/prior Treatment) HPI Patient states for the past 3-4 months she's been getting chest tightness and soreness. She states the pain is in the middle of her chest and rubs her hand up and down just to the right of her sternum and radiates into her left breast and into her left shoulder. She states the pain is sharp and pressure in character and sometimes tight and achy. She states the pain last 2-3 hours at a time. She also feels a "ticking" in her chest that feels like a spasm. She states she gets that discomfort after physical activity. She states nothing makes it feel better. Sometimes she has shortness of breath. She does not have diaphoresis. She states her face and ears feel hot when it happens. She has nausea and she feels dizzy like she's going to faint and she gets panicky. She states sometimes she feels like her food gets stuck like a lump in then she feels like there is lump is moving around in her stomach. She denies any pain on swallowing. She denies diarrhea. She states she has constipation. She denies any pain or swelling of her lower extremities. She states she has been on Zyrtec for about 3 months and it's not helping with her allergy symptoms. She states she has a lot of itching. Of note she has been on the Zyrtec 3 months and she's had the chest discomfort for around the same period of time. She was prescribed Flexeril without improvement by her PCP. She was seen yesterday at [facility] for the [facility] time and said they were going to do some thyroid test and Cushing's test on her.   [facility]  Past Medical History  Diagnosis Date  . Hydradenitis   .  Polycystic ovarian disease   . Depression   . Chronic back pain   . Hypothyroid   . Panic attack   . Essential hypertension   . Tachycardia    Past Surgical History  Procedure Laterality Date  . Cholecystectomy    . Dilation and curettage of uterus     No family history on file. Social History  Substance Use Topics  . Smoking status: Current Some Day Smoker -- 0.25 packs/day for 9 years    Types: Cigarettes    Start date: 08/12/2003    Last Attempt to Quit: 03/08/2012  . Smokeless tobacco: Never Used  . Alcohol Use: 0.0 oz/week    0 Standard drinks or equivalent per week     Comment: Rarely   Unemployed because of dizziness for 2 years States she quit smoking in July 2016  OB History    Gravida Para Term Preterm AB TAB SAB Ectopic Multiple Living   3    3  3         Review of Systems  All other systems reviewed and are negative.     Allergies  Tramadol; Ativan; Banana; Latex; and Tylenol with codeine #3  Home Medications   Prior to Admission medications   Medication Sig Start Date End Date Taking? Authorizing Provider  acetaminophen (TYLENOL) 500 MG tablet Take 1,000 mg by mouth every 6 (six) hours as needed for mild pain or  headache.   Yes Historical Provider, MD  cetirizine (ZYRTEC) 10 MG tablet Take 10 mg by mouth daily.   Yes Historical Provider, MD  cyclobenzaprine (FLEXERIL) 10 MG tablet Take 1 tablet (10 mg total) by mouth 2 (two) times daily as needed for muscle spasms. 12/24/14  Yes Lavera Guise, MD  fluticasone (FLONASE) 50 MCG/ACT nasal spray Place 1 spray into both nostrils 2 (two) times daily.   Yes Historical Provider, MD  ibuprofen (ADVIL,MOTRIN) 200 MG tablet Take 400 mg by mouth every 6 (six) hours as needed for mild pain.   Yes Historical Provider, MD  levothyroxine (SYNTHROID, LEVOTHROID) 75 MCG tablet Take 75 mcg by mouth daily before breakfast.   Yes Historical Provider, MD  meclizine (ANTIVERT) 25 MG tablet Take 25 mg by mouth 3 (three) times  daily.   Yes Historical Provider, MD  Multiple Vitamin (MULTIVITAMIN WITH MINERALS) TABS tablet Take 1 tablet by mouth daily.   Yes Historical Provider, MD  potassium chloride SA (K-DUR,KLOR-CON) 20 MEQ tablet Take 20 mEq by mouth daily.   Yes Historical Provider, MD  ibuprofen (ADVIL,MOTRIN) 800 MG tablet Take 1 tablet (800 mg total) by mouth every 8 (eight) hours as needed for moderate pain. 12/24/14   Lavera Guise, MD  ketorolac (TORADOL) 10 MG tablet Take 1 tablet (10 mg total) by mouth every 6 (six) hours as needed. 01/11/15   Donnetta Hutching, MD  methocarbamol (ROBAXIN) 500 MG tablet Take 1 or 2 po Q 6hrs for pain 03/23/15   Devoria Albe, MD  naproxen (NAPROSYN) 500 MG tablet Take 1 po BID with food prn pain 03/23/15   Devoria Albe, MD  predniSONE (DELTASONE) 20 MG tablet Take 20 mg by mouth as directed. Reported on 03/22/2015    Historical Provider, MD   BP 122/81 mmHg  Pulse 124  Temp(Src) 97.6 F (36.4 C) (Oral)  Resp 12  Ht 5\' 3"  (1.6 m)  Wt 330 lb (149.687 kg)  BMI 58.47 kg/m2  SpO2 100%  LMP 02/14/2015 (Approximate)  Vital signs normal except for tachycardia  Physical Exam  Constitutional: She is oriented to person, place, and time. She appears well-developed and well-nourished.  Non-toxic appearance. She does not appear ill. No distress.  Morbidly obese  HENT:  Head: Normocephalic and atraumatic.  Right Ear: External ear normal.  Left Ear: External ear normal.  Nose: Nose normal. No mucosal edema or rhinorrhea.  Mouth/Throat: Oropharynx is clear and moist and mucous membranes are normal. No dental abscesses or uvula swelling.  Eyes: Conjunctivae and EOM are normal. Pupils are equal, round, and reactive to light.  Neck: Normal range of motion and full passive range of motion without pain. Neck supple.  Cardiovascular: Normal rate, regular rhythm and normal heart sounds.  Exam reveals no gallop and no friction rub.   No murmur heard. Pulmonary/Chest: Effort normal and breath sounds  normal. No respiratory distress. She has no wheezes. She has no rhonchi. She has no rales. She exhibits tenderness. She exhibits no crepitus.    Patient is tender along the right costochondral junctions which also reproduces the area where she states her pain is located  Abdominal: Soft. Normal appearance and bowel sounds are normal. She exhibits no distension. There is no tenderness. There is no rebound and no guarding.  Musculoskeletal: Normal range of motion. She exhibits no edema or tenderness.  Moves all extremities well.   Neurological: She is alert and oriented to person, place, and time. She has normal strength. No cranial  nerve deficit.  Skin: Skin is warm, dry and intact. No rash noted. No erythema. No pallor.  Psychiatric: She has a normal mood and affect. Her speech is normal and behavior is normal. Her mood appears not anxious.  Nursing note and vitals reviewed.   ED Course  Procedures (including critical care time)  Medications  ketorolac (TORADOL) 30 MG/ML injection 30 mg (30 mg Intravenous Given 03/22/15 2357)  iohexol (OMNIPAQUE) 350 MG/ML injection 125 mL (125 mLs Intravenous Contrast Given 03/23/15 0155)    Patient was given IV Toradol for her chest wall pain.  Patient was rechecked at 1:30 AM. We discussed her test results. She is agreeable to getting a chest CT scan to check for possible pulmonary embolus since her d-dimer was slightly elevated. She also is noted to have a tachycardia. However when I noted the room her heart rate was 92 and immediately jumped up to 110.  At time of discharge I talked to patient that she most likely has some chest wall pain and anxiety. She will be referred to [facility]. She was put on anti-inflammatory and a different muscle relaxer since she states the Flexeril isn't helping. She can use ice and heat on her chest wall for comfort. She was encouraged to follow-up at the [facility] who are now her primary care doctor.   Labs  Review Results for orders placed or performed during the hospital encounter of 03/22/15  Comprehensive metabolic panel  Result Value Ref Range   Sodium 140 135 - 145 mmol/L   Potassium 4.0 3.5 - 5.1 mmol/L   Chloride 101 101 - 111 mmol/L   CO2 30 22 - 32 mmol/L   Glucose, Bld 122 (H) 65 - 99 mg/dL   BUN 12 6 - 20 mg/dL   Creatinine, Ser 1.61 0.44 - 1.00 mg/dL   Calcium 9.4 8.9 - 09.6 mg/dL   Total Protein 8.0 6.5 - 8.1 g/dL   Albumin 3.9 3.5 - 5.0 g/dL   AST 17 15 - 41 U/L   ALT 22 14 - 54 U/L   Alkaline Phosphatase 85 38 - 126 U/L   Total Bilirubin 0.6 0.3 - 1.2 mg/dL   GFR calc non Af Amer >60 >60 mL/min   GFR calc Af Amer >60 >60 mL/min   Anion gap 9 5 - 15  Troponin I  Result Value Ref Range   Troponin I <0.03 <0.031 ng/mL  CBC with Differential  Result Value Ref Range   WBC 14.4 (H) 4.0 - 10.5 K/uL   RBC 5.25 (H) 3.87 - 5.11 MIL/uL   Hemoglobin 15.0 12.0 - 15.0 g/dL   HCT 04.5 40.9 - 81.1 %   MCV 87.2 78.0 - 100.0 fL   MCH 28.6 26.0 - 34.0 pg   MCHC 32.8 30.0 - 36.0 g/dL   RDW 91.4 78.2 - 95.6 %   Platelets 325 150 - 400 K/uL   Neutrophils Relative % 64 %   Neutro Abs 9.1 (H) 1.7 - 7.7 K/uL   Lymphocytes Relative 29 %   Lymphs Abs 4.2 (H) 0.7 - 4.0 K/uL   Monocytes Relative 6 %   Monocytes Absolute 0.9 0.1 - 1.0 K/uL   Eosinophils Relative 1 %   Eosinophils Absolute 0.1 0.0 - 0.7 K/uL   Basophils Relative 0 %   Basophils Absolute 0.0 0.0 - 0.1 K/uL  Magnesium  Result Value Ref Range   Magnesium 1.9 1.7 - 2.4 mg/dL  D-dimer, quantitative (not at [facility])  Result  Value Ref Range   D-Dimer, Quant 0.52 (H) 0.00 - 0.50 ug/mL-FEU    Laboratory interpretation all normal except for leukocytosis, mildly elevated d-dimer    Imaging Review Dg Chest 2 View  03/23/2015  CLINICAL DATA:  Chronic generalized chest pain, shortness of breath and chest tightness. Initial encounter. EXAM: CHEST  2 VIEW COMPARISON:  Chest radiograph performed 01/15/2015 FINDINGS: The lungs are  well-aerated and clear. There is no evidence of focal opacification, pleural effusion or pneumothorax. The heart is normal in size; the mediastinal contour is within normal limits. No acute osseous abnormalities are seen. IMPRESSION: No acute cardiopulmonary process seen. Electronically Signed   By: Roanna Raider M.D.   On: 03/23/2015 00:27   Ct Angio Chest Pe W/cm &/or Wo Cm  03/23/2015  CLINICAL DATA:  30 year old female with chest pain and shortness of breath EXAM: CT ANGIOGRAPHY CHEST WITH CONTRAST TECHNIQUE: Multidetector CT imaging of the chest was performed using the standard protocol during bolus administration of intravenous contrast. Multiplanar CT image reconstructions and MIPs were obtained to evaluate the vascular anatomy. CONTRAST:  OMNIPAQUE IOHEXOL 350 MG/ML SOLN COMPARISON:  Radiograph dated 03/23/2015 FINDINGS: Evaluation is limited due to streak artifact caused by patient's arms. The lungs are clear. There is no pleural effusion or pneumothorax. The central airways are patent. The thoracic aorta appears unremarkable. No CT evidence of pulmonary embolism. There is no cardiomegaly or pericardial effusion. No hilar or mediastinal adenopathy. The esophagus is grossly unremarkable. No thyroid nodules identified. There is no axillary adenopathy the chest wall soft tissues appear unremarkable. The osseous structures are intact. Review of the MIP images confirms the above findings. IMPRESSION: No CT evidence of pulmonary embolism. Electronically Signed   By: Elgie Collard M.D.   On: 03/23/2015 02:11   I have personally reviewed and evaluated these images and lab results as part of my medical decision-making.   EKG Interpretation   Date/Time:  Saturday March 22 2015 22:16:02 EST Ventricular Rate:  132 PR Interval:  144 QRS Duration: 78 QT Interval:  305 QTC Calculation: 452 R Axis:   36 Text Interpretation:  Sinus tachycardia Otherwise within normal limits  Since last tracing  rate faster (23 Dec 2014) Confirmed by [facility]  MD-I, Tremont Gavitt  ([id number]) on 03/22/2015 11:04:36 PM      MDM   Final diagnoses:  Chest wall pain  Anxiety    New Prescriptions   METHOCARBAMOL (ROBAXIN) 500 MG TABLET    Take 1 or 2 po Q 6hrs for pain   NAPROXEN (NAPROSYN) 500 MG TABLET    Take 1 po BID with food prn pain    Plan discharge  Devoria Albe, MD, Concha Pyo, MD 03/23/15 [phone]

## 2015-03-23 NOTE — Discharge Instructions (Signed)
Use ice and heat to your chest for comfort. Take the medication as prescribed. Consider being evaluated for your anxiety, [facility] [place]. [facility].     Panic Attacks Panic attacks are sudden, short feelings of great fear or discomfort. You may have them for no reason when you are relaxed, when you are uneasy (anxious), or when you are sleeping.  HOME CARE  Take all your medicines as told.  Check with your doctor before starting new medicines.  Keep all doctor visits. GET HELP IF:  You are not able to take your medicines as told.  Your symptoms do not get better.  Your symptoms get worse. GET HELP RIGHT AWAY IF:  Your attacks seem different than your normal attacks.  You have thoughts about hurting yourself or others.  You take panic attack medicine and you have a side effect. MAKE SURE YOU:  Understand these instructions.  Will watch your condition.  Will get help right away if you are not doing well or get worse.   This information is not intended to replace advice given to you by your health care provider. Make sure you discuss any questions you have with your health care provider.   Document Released: 03/27/2010 Document Revised: 12/13/2012 Document Reviewed: 10/06/2012 Elsevier Interactive Patient Education 2016 Elsevier Inc.  Chest Wall Pain Chest wall pain is pain in or around the bones and muscles of your chest. Sometimes, an injury causes this pain. Sometimes, the cause may not be known. This pain may take several weeks or longer to get better. HOME CARE Pay attention to any changes in your symptoms. Take these actions to help with your pain:  Rest as told by your doctor.  Avoid activities that cause pain. Try not to use your chest, belly (abdominal), or side muscles to lift heavy things.  If directed, apply ice to the painful area:  Put ice in a plastic bag.  Place a towel between  your skin and the bag.  Leave the ice on for 20 minutes, 2-3 times per day.  Take over-the-counter and prescription medicines only as told by your doctor.  Do not use tobacco products, including cigarettes, chewing tobacco, and e-cigarettes. If you need help quitting, ask your doctor.  Keep all follow-up visits as told by your doctor. This is important. GET HELP IF:  You have a fever.  Your chest pain gets worse.  You have new symptoms. GET HELP RIGHT AWAY IF:  You feel sick to your stomach (nauseous) or you throw up (vomit).  You feel sweaty or light-headed.  You have a cough with phlegm (sputum) or you cough up blood.  You are short of breath.   This information is not intended to replace advice given to you by your health care provider. Make sure you discuss any questions you have with your health care provider.   Document Released: 08/11/2007 Document Revised: 11/13/2014 Document Reviewed: 05/20/2014 Elsevier Interactive Patient Education [employer]2016 Elsevier Inc.

## 2015-05-07 ENCOUNTER — Encounter (HOSPITAL_COMMUNITY): Payer: Self-pay | Admitting: Emergency Medicine

## 2015-05-07 ENCOUNTER — Emergency Department (HOSPITAL_COMMUNITY)
Admission: EM | Admit: 2015-05-07 | Discharge: 2015-05-07 | Disposition: A | Discharge status: Home / Self Care | Payer: Self-pay | Attending: Emergency Medicine | Admitting: Emergency Medicine

## 2015-05-07 DIAGNOSIS — H9203 Otalgia, bilateral: Secondary | ICD-10-CM | POA: Insufficient documentation

## 2015-05-07 DIAGNOSIS — F17210 Nicotine dependence, cigarettes, uncomplicated: Secondary | ICD-10-CM | POA: Insufficient documentation

## 2015-05-07 DIAGNOSIS — I10 Essential (primary) hypertension: Secondary | ICD-10-CM | POA: Insufficient documentation

## 2015-05-07 DIAGNOSIS — Z7951 Long term (current) use of inhaled steroids: Secondary | ICD-10-CM | POA: Insufficient documentation

## 2015-05-07 DIAGNOSIS — Z792 Long term (current) use of antibiotics: Secondary | ICD-10-CM | POA: Insufficient documentation

## 2015-05-07 DIAGNOSIS — E039 Hypothyroidism, unspecified: Secondary | ICD-10-CM | POA: Insufficient documentation

## 2015-05-07 DIAGNOSIS — Z872 Personal history of diseases of the skin and subcutaneous tissue: Secondary | ICD-10-CM | POA: Insufficient documentation

## 2015-05-07 DIAGNOSIS — G8929 Other chronic pain: Secondary | ICD-10-CM | POA: Insufficient documentation

## 2015-05-07 DIAGNOSIS — Z79899 Other long term (current) drug therapy: Secondary | ICD-10-CM | POA: Insufficient documentation

## 2015-05-07 DIAGNOSIS — Z91040 Latex allergy status: Secondary | ICD-10-CM | POA: Insufficient documentation

## 2015-05-07 DIAGNOSIS — J322 Chronic ethmoidal sinusitis: Secondary | ICD-10-CM | POA: Insufficient documentation

## 2015-05-07 DIAGNOSIS — Z8659 Personal history of other mental and behavioral disorders: Secondary | ICD-10-CM | POA: Insufficient documentation

## 2015-05-07 MED ORDER — HYDROCODONE-ACETAMINOPHEN 5-325 MG PO TABS: 1.0000 | ORAL_TABLET | ORAL | Status: AC | PRN

## 2015-05-07 MED ORDER — HYDROCODONE-ACETAMINOPHEN 5-325 MG PO TABS
1.0000 | ORAL_TABLET | Freq: Once | ORAL | Status: AC
  Administered 2015-05-07: 1 via ORAL
  Filled 2015-05-07: qty 1

## 2015-05-07 MED ORDER — PSEUDOEPHEDRINE HCL 60 MG PO TABS: 60.0000 mg | ORAL_TABLET | ORAL | Status: AC | PRN

## 2015-05-07 NOTE — Discharge Instructions (Signed)

## 2015-05-07 NOTE — ED Notes (Signed)
Pt c/o rt eye pressure and states she can feel something move in her head.

## 2015-05-09 NOTE — ED Provider Notes (Signed)
CSN: [id number]     Arrival date & time 05/07/15  2034 History   First MD Initiated Contact with Patient 05/07/15 2133     Chief Complaint  Patient presents with  . Facial Pain     (Consider location/radiation/quality/duration/timing/severity/associated sxs/prior Treatment) The history is provided by the patient.   Laurie Macias is a 30 y.o. female with a history of frequent sinus problems and just completed a course of amoxil and zyrtec by her pcp, presenting with persistent pain and pressure behind her eyes along with persistent nasal irritation(states it feels dry all the time) and congestion.  She denies fevers or chills and has had no purulent or bloody nasal discharge. She does have bilateral ear pressure without drainage or loss of hearing acuity.  Denies vision changes.  She also reports a clicking noise  eminating from her sinus/nose when she plugs her nose and tries to blow (which she demonstrates during the interview and is audible).  She is desirous of seeing an ENT specialist but [facility] months and will not be able to get in sooner. She has been told by prior CT that she has chronic problems with her ethmoid sinus.   She denies dizziness, fevers, chills, nausea, vomiting, confusion, headaches or focal weakness.     Past Medical History  Diagnosis Date  . Hydradenitis   . Polycystic ovarian disease   . Depression   . Chronic back pain   . Hypothyroid   . Panic attack   . Essential hypertension   . Tachycardia    Past Surgical History  Procedure Laterality Date  . Cholecystectomy    . Dilation and curettage of uterus     History reviewed. No pertinent family history. Social History  Substance Use Topics  . Smoking status: Current Some Day Smoker -- 0.25 packs/day for 9 years    Types: Cigarettes    Start date: 08/12/2003    Last Attempt to Quit: 03/08/2012  . Smokeless tobacco: Never Used  . Alcohol Use:  0.0 oz/week    0 Standard drinks or equivalent per week     Comment: Rarely   OB History    Gravida Para Term Preterm AB TAB SAB Ectopic Multiple Living   Review of Systems  Constitutional: Negative for fever and chills.  HENT: Positive for congestion, ear pain and sinus pressure. Negative for ear discharge, facial swelling, hearing loss, rhinorrhea, sore throat, trouble swallowing and voice change.   Eyes: Negative for discharge.  Respiratory: Negative for cough, shortness of breath, wheezing and stridor.   Cardiovascular: Negative for chest pain.  Gastrointestinal: Negative for abdominal pain.  Genitourinary: Negative.   Neurological: Negative for dizziness and headaches.      Allergies  Tramadol; Ativan; Banana; Latex; and Tylenol with codeine #3  Home Medications   Prior to Admission medications   Medication Sig Start Date End Date Taking? Authorizing Provider  acetaminophen (TYLENOL) 500 MG tablet Take 1,000 mg by mouth every 6 (six) hours as needed for mild pain or headache.   Yes Historical Provider, MD  cetirizine (ZYRTEC) 10 MG tablet Take 10 mg by mouth daily.   Yes Historical Provider, MD  fluticasone (FLONASE) 50 MCG/ACT nasal spray Place 1 spray into both nostrils 2 (two) times daily.   Yes Historical Provider, MD  levothyroxine (SYNTHROID, LEVOTHROID) 75 MCG tablet Take 75  mcg by mouth daily before breakfast.   Yes Historical Provider, MD  meclizine (ANTIVERT) 25 MG tablet Take 25 mg by mouth 3 (three) times daily as needed for dizziness.    Yes Historical Provider, MD  Multiple Vitamin (MULTIVITAMIN WITH MINERALS) TABS tablet Take 1 tablet by mouth daily.   Yes Historical Provider, MD  naproxen (NAPROSYN) 500 MG tablet Take 1 po BID with food prn pain Patient taking differently: Take 500 mg by mouth 2 (two) times daily as needed for mild pain.  03/23/15  Yes Devoria AlbeIva Knapp, MD  potassium chloride SA (K-DUR,KLOR-CON) 20 MEQ tablet Take 20 mEq by mouth  daily.   Yes Historical Provider, MD  amoxicillin (AMOXIL) 500 MG capsule Take 1 capsule by mouth 3 (three) times daily. Reported on 05/07/2015 04/25/15   Historical Provider, MD  HYDROcodone-acetaminophen (NORCO/VICODIN) 5-325 MG tablet Take 1 tablet by mouth every 4 (four) hours as needed. 05/07/15   Burgess AmorJulie Efe Fazzino, PA-C  pseudoephedrine (SUDAFED) 60 MG tablet Take 1 tablet (60 mg total) by mouth every 4 (four) hours as needed for congestion. 05/07/15   Burgess AmorJulie Elliannah Wayment, PA-C   BP 136/81 mmHg  Pulse 101  Temp(Src) 98.2 F (36.8 C) (Oral)  Resp 20  Ht 5\' 3"  (1.6 m)  Wt 149.687 kg  BMI 58.47 kg/m2  SpO2 100%  LMP 04/18/2015 Physical Exam  Constitutional: She is oriented to person, place, and time. She appears well-developed and well-nourished.  HENT:  Head: Normocephalic and atraumatic.  Right Ear: Tympanic membrane and ear canal normal.  Left Ear: Tympanic membrane and ear canal normal.  Nose: Mucosal edema present. No rhinorrhea.  Mouth/Throat: Uvula is midline, oropharynx is clear and moist and mucous membranes are normal. No oropharyngeal exudate, posterior oropharyngeal edema, posterior oropharyngeal erythema or tonsillar abscesses.  No appreciable sinus pain with percussion.  Eyes: Conjunctivae and EOM are normal. Pupils are equal, round, and reactive to light.  Cardiovascular: Normal rate and normal heart sounds.   Pulmonary/Chest: Effort normal. No respiratory distress. She has no wheezes. She has no rales.  Abdominal: Soft. There is no tenderness.  Musculoskeletal: Normal range of motion.  Neurological: She is alert and oriented to person, place, and time.  Skin: Skin is warm and dry. No rash noted.  Psychiatric: She has a normal mood and affect.    ED Course  Procedures (including critical care time) Labs Review Labs Reviewed - No data to display  Imaging Review No results found. I have personally reviewed and evaluated these images and lab results as part of my medical  decision-making.   EKG Interpretation None      MDM   Final diagnoses:  Chronic ethmoidal sinusitis    Pt advised to hold zyrtec since she has complaint of dry nose, sudafed instead for congestion issue. Discussed adding moisture including saline spray, consider adding humidifier to home. She was prescribed a few hydrocodone tablets for pain relief. Referral to Dr. Suszanne Connerseoh prn if sx persist and/or f/u with pcp as planned.  The patient appears reasonably screened and/or stabilized for discharge and I doubt any other medical condition or other [facility] requiring further screening, evaluation, or treatment in the ED at this time prior to discharge.     Burgess AmorJulie Micco Bourbeau, PA-C 05/09/15 1534  Marily MemosJason Mesner, MD 05/12/15 [phone]

## 2016-10-20 IMAGING — CT CT ANGIO CHEST
2 of 6 series · 6 of 36 positions shown · IV contrast (Omnipaque 300)
Comparison: Radiograph dated 03/23/2015

CLINICAL DATA: 21-year-old female with chest pain and shortness of
breath

EXAM:
CT ANGIOGRAPHY CHEST WITH CONTRAST
TECHNIQUE: Multidetector CT imaging of the chest was performed using the
standard protocol during bolus administration of intravenous
contrast. Multiplanar CT image reconstructions and MIPs were
obtained to evaluate the vascular anatomy.
CONTRAST:  125mL OMNIPAQUE IOHEXOL 350 MG/ML SOLN

[Series 5: pe 3.0 b40f · axial · 0.73mm/px · z∈[-325,-124]mm · 5 of 101 slices shown]
[im 17/101  lung]
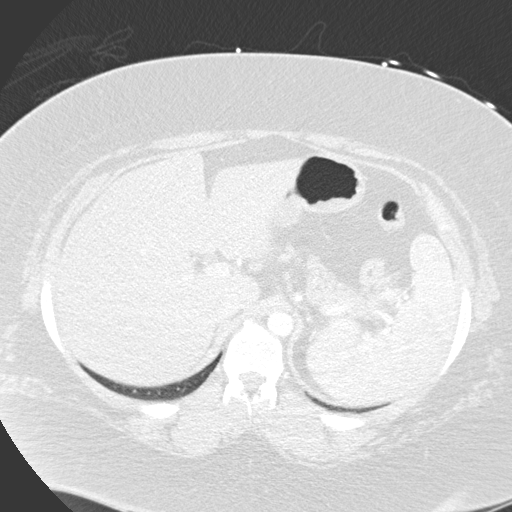
[im 34/101  mediastinal]
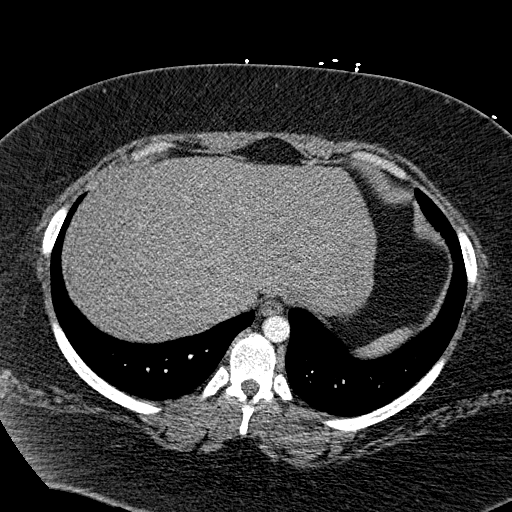
[im 51/101  lung]
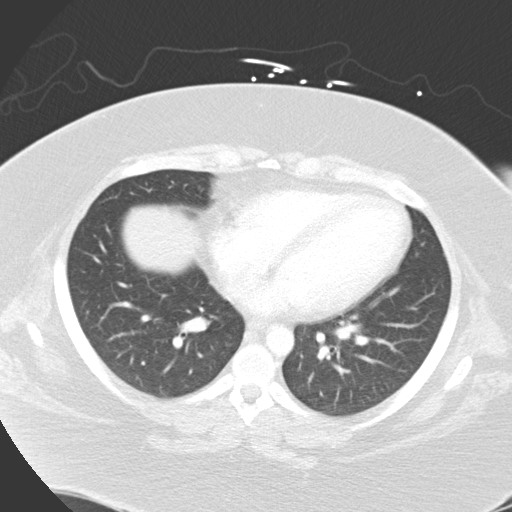
[im 67/101  mediastinal]
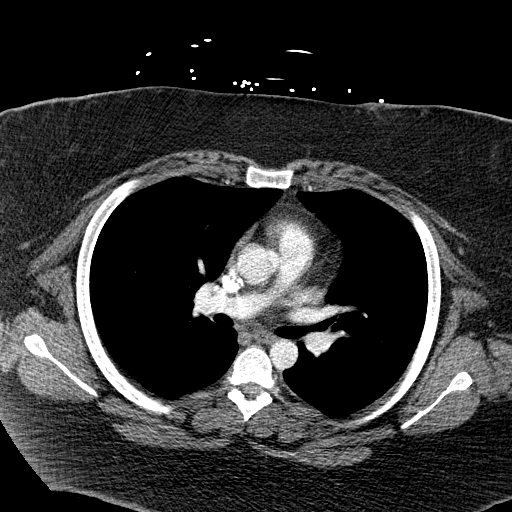
[im 84/101  lung]
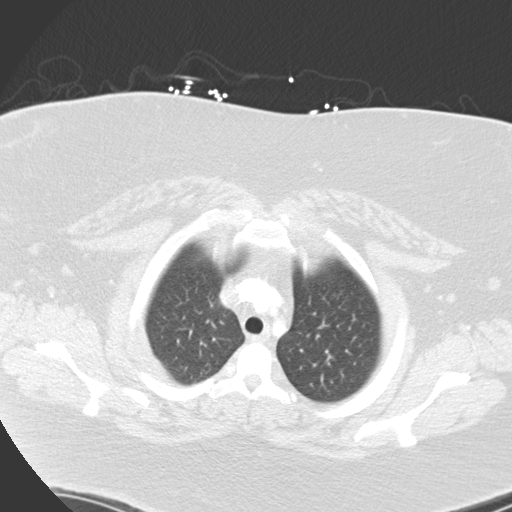

[Series 7: mpr coronal pe 3mm · coronal · 0.61mm/px · 1 of 92 slices shown]
[im 46/92  mediastinal]
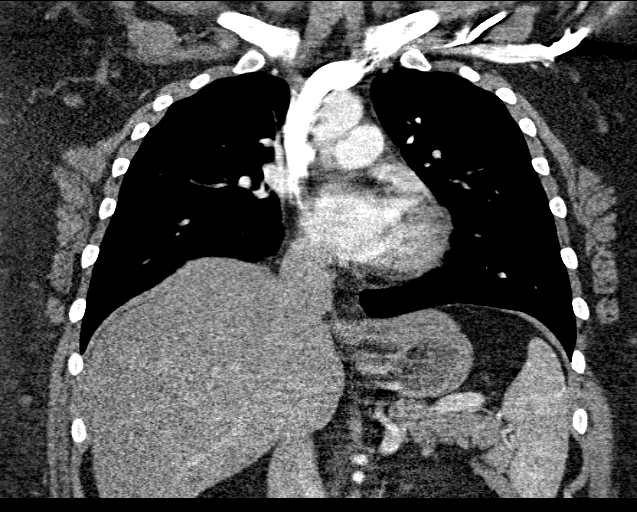

[6 of 36 positions shown; findings below may reference images not displayed]

FINDINGS: Evaluation is limited due to streak artifact caused by patient's
arms.

The lungs are clear. There is no pleural effusion or pneumothorax.
The central airways are patent.

The thoracic aorta appears unremarkable. No CT evidence of pulmonary
embolism. There is no cardiomegaly or pericardial effusion. No hilar
or mediastinal adenopathy. The esophagus is grossly unremarkable. No
thyroid nodules identified. There is no axillary adenopathy the
chest wall soft tissues appear unremarkable. The osseous structures
are intact.

Review of the MIP images confirms the above findings.
IMPRESSION: No CT evidence of pulmonary embolism.
